# Patient Record
Sex: Male | Born: 1955
Health system: Southern US, Community
[De-identification: ages and names within clinical notes are randomized; demographics above are authoritative.]

## PROBLEM LIST (undated history)

## (undated) DIAGNOSIS — N189 Chronic kidney disease, unspecified: Secondary | ICD-10-CM

## (undated) DIAGNOSIS — S82899A Other fracture of unspecified lower leg, initial encounter for closed fracture: Secondary | ICD-10-CM

## (undated) DIAGNOSIS — C801 Malignant (primary) neoplasm, unspecified: Secondary | ICD-10-CM

## (undated) DIAGNOSIS — E039 Hypothyroidism, unspecified: Secondary | ICD-10-CM

## (undated) HISTORY — PX: JOINT REPLACEMENT: SHX530

## (undated) HISTORY — PX: HERNIA REPAIR: SHX51

## (undated) HISTORY — PX: OTHER SURGICAL HISTORY: SHX169

## (undated) HISTORY — PX: CHOLECYSTECTOMY: SHX55

---

## 1999-02-28 ENCOUNTER — Emergency Department (HOSPITAL_COMMUNITY): Admission: EM | Admit: 1999-02-28 | Discharge: 1999-02-28 | Payer: Self-pay | Admitting: Emergency Medicine

## 2000-07-05 ENCOUNTER — Inpatient Hospital Stay: Admission: EM | Admit: 2000-07-05 | Discharge: 2000-07-07 | Payer: Self-pay | Admitting: *Deleted

## 2000-07-06 ENCOUNTER — Encounter: Payer: Self-pay | Admitting: General Surgery

## 2000-07-06 ENCOUNTER — Encounter (INDEPENDENT_AMBULATORY_CARE_PROVIDER_SITE_OTHER): Payer: Self-pay | Admitting: Specialist

## 2001-08-28 ENCOUNTER — Encounter: Payer: Self-pay | Admitting: Internal Medicine

## 2001-08-28 ENCOUNTER — Ambulatory Visit (HOSPITAL_COMMUNITY): Admission: RE | Admit: 2001-08-28 | Discharge: 2001-08-28 | Payer: Self-pay | Admitting: Internal Medicine

## 2001-09-17 ENCOUNTER — Ambulatory Visit (HOSPITAL_COMMUNITY): Admission: RE | Admit: 2001-09-17 | Discharge: 2001-09-17 | Payer: Self-pay | Admitting: Internal Medicine

## 2003-04-28 ENCOUNTER — Encounter (INDEPENDENT_AMBULATORY_CARE_PROVIDER_SITE_OTHER): Payer: Self-pay | Admitting: Specialist

## 2003-04-28 ENCOUNTER — Ambulatory Visit (HOSPITAL_COMMUNITY): Admission: RE | Admit: 2003-04-28 | Discharge: 2003-04-28 | Payer: Self-pay

## 2004-12-18 ENCOUNTER — Ambulatory Visit: Payer: Self-pay | Admitting: Gastroenterology

## 2005-09-24 ENCOUNTER — Ambulatory Visit: Payer: Self-pay | Admitting: Internal Medicine

## 2005-10-01 ENCOUNTER — Encounter (INDEPENDENT_AMBULATORY_CARE_PROVIDER_SITE_OTHER): Payer: Self-pay | Admitting: Specialist

## 2005-10-01 ENCOUNTER — Ambulatory Visit: Payer: Self-pay | Admitting: Internal Medicine

## 2006-10-03 ENCOUNTER — Encounter: Admission: RE | Admit: 2006-10-03 | Discharge: 2006-10-03 | Payer: Self-pay | Admitting: Sports Medicine

## 2008-09-10 ENCOUNTER — Encounter: Admission: RE | Admit: 2008-09-10 | Discharge: 2008-09-10 | Payer: Self-pay | Admitting: Sports Medicine

## 2010-08-24 ENCOUNTER — Encounter: Payer: Self-pay | Admitting: Internal Medicine

## 2010-10-11 ENCOUNTER — Encounter: Admission: RE | Admit: 2010-10-11 | Discharge: 2010-10-11 | Payer: Self-pay | Admitting: Neurosurgery

## 2010-12-19 NOTE — Letter (Signed)
Summary: Colonoscopy Letter  [facility]  [address] [place], [place] [address]   Phone: [phone]  Fax: [phone]      August 24, 2010 MRN: [id number]   Kyle Glass [address] [place], [place]  [address]   Dear [name],   According to your medical record, it is time for you to schedule a Colonoscopy. The American Cancer Society recommends this procedure as a method to detect early colon cancer. Patients with a family history of colon cancer, or a personal history of colon polyps or inflammatory bowel disease are at increased risk.  This letter has been generated based on the recommendations made at the time of your procedure. If you feel that in your particular situation this may no longer apply, please contact our office.  Please call our office at [phone] to schedule this appointment or to update your records at your earliest convenience.  Thank you for cooperating with [place] to provide you with the very best care possible.   Sincerely,  Wilhemina Bonito. Marina Goodell, M.D.  [facility] [phone]

## 2011-04-06 NOTE — Op Note (Signed)
[facility]  Patient:    PEDER, ALLUMS                         MRN: [id number] Proc. Date: 07/06/00 Adm. Date:  [id number] Disc. Date: [id number] Attending:  Chevis Pretty S                           Operative Report  PREOPERATIVE DIAGNOSIS:  Cholecystitis with cholelithiasis.  POSTOPERATIVE DIAGNOSIS:  Cholecystitis with cholelithiasis.  OPERATION PERFORMED:  Laparoscopic cholecystectomy with intraoperative cholangiogram.  SURGEON:  Chevis Pretty, M.D.  ASSISTANT:  Thornton Park. Daphine Deutscher, M.D.  ANESTHESIA:  General endotracheal.  DESCRIPTION OF PROCEDURE:  After informed consent was obtained, the patient was brought to the operating room and placed in the supine position on the operating room table.  After adequate induction of general anesthesia, the patients abdomen was prepped with Betadine and draped in the usual sterile manner.  An infraumbilical transverse incision was made with the 15 blade knife.  This was carried down through the skin and subcutaneous tissue with blunt dissection until the fascia of the anterior abdominal wall was encountered.  The fascia was incised with the 15 blade and each side was grasped with Kocher clamps and elevated.  The peritoneum was probed with a tonsil clamp until entry into the abdomen was accomplished.  The anterior abdominal wall was palpated with the finger and was free of any adhesions.  A 0 Vicryl pursestring stitch was placed around the fascia of this incision. The Hasson cannula was then placed through this and the abdomen was insufflated with carbon dioxide.  The patient tolerated this well.  Next, a small transverse upper midline incision was made with the 15 blade knife.  A 10 mm trocar was placed through this incision into the abdomen through the falciform ligament under direct vision.  Two small incisions were placed laterally on the right side of the abdomen below the costal margin.  A 5 mm trocar was  placed through each one of these incisions into the abdomen again under direct vision.  A blunt grasper was placed through the lateral most port and used to grasp the dome of the gallbladder and lift it anteriorly and superiorly.  A blunt grasper was then placed to the other lateral port and used to retract on the fundus of the gallbladder.  A dissector was placed through the upper midline port used to bluntly dissect the peritoneal reflection at the cystic duct gallbladder junction.  The peritoneal reflection was incised with the dissector using the Bovie cautery and then blunt dissection was used to circumferentially dissect out the cystic duct gallbladder junction.  During this portion of the procedure, it did appear that there was a stone lodged in this area of the gallbladder neck and cystic duct junction.  It was able to be milked back into the gallbladder.  A clip was placed distally on the gallbladder neck and the cystic duct gallbladder junction was opened with a pair of laparoscopic scissors.  This area was only partially transected so that bile could be milked back through this hole.  Next, a 12-gauge angiocath was placed through the anterior abdominal wall and a catheter was placed through this.  The catheter was able to be placed in the cystic duct using the [facility] and was anchored in place with a clip.  An intraoperative cholangiogram was obtained which showed a long cystic  duct and no filling defects within the cystic duct or common ducts.  Contrast readily filled the duodenum.  The retractors were then placed as they had been previously described and the proximal clip was removed.  The catheter was also removed from the cystic duct.  Three clips were then placed proximally on the cystic duct and the rest of the cystic duct was divided between the three proximal and one distal clip.  Next, the cystic artery was identified and was dissected free circumferentially  using the [place].  Three proximal clips and one distal clip was placed on the cystic artery and the artery was then divided between the two using the laparoscopic scissors.  A small accessory vessel was identified posterior to this and two clips were placed proximally and one distally and that vessel was divided between the two.  Next, hook cautery was used to dissect the gallbladder from the liver bed using a combination of electrocautery and blunt dissection before the gallbladder was completely removed from the liver bed.  Several bleeding points were cauterized and the liver bed was then examined and found to be hemostatic.  The gallbladder was then removed from the liver bed.  The laparoscopic camera was then exchanged into the upper midline 10 mm port and an endo bag was placed through the Hasson cannula.  The gallbladder was placed within the endo bag and removed through the umbilical port under direct vision.  The Hasson cannula was then replaced through the umbilical port and a laparoscope was switched back to the Hasson cannula.  The liver bed was examined one more time and found to be hemostatic.  The abdomen was irrigated with copious amounts of saline.  All the ports were then removed under direct vision and the gas allowed to escape the abdomen.  The umbilical fascia was closed with the previously placed 0 Vicryl pursestring.  The rest of the skin incisions were closed with interrupted 4-0 Monocryl subcuticular stitches. Benzoin and Steri-Strips were applied.  The patient tolerated the procedure well at the end of the case.  All needle, sponge and instrument counts were correct.  He was awakened and taken to the recovery room in stable condition. D:  07/06/00 TD:  07/08/00 Job: [id number]

## 2011-04-06 NOTE — Discharge Summary (Signed)
[facility]  Patient:    Kyle Glass, Kyle Glass                         MRN: [id number] Adm. Date:  [id number] Disc. Date: [id number] Attending:  Caleen Essex                           Discharge Summary  REASON FOR ADMISSION:  [name] is a 55 year old white male who was admitted on the 17th with acute cholecystitis with cholelithiasis.  He was kept on antibiotics overnight and the following day was taken to the operating room where he underwent laparoscopic cholecystectomy with intraoperative cholangiogram.  The cholangiogram appeared to be normal.  Postoperatively he was started on a diet, and he has been ambulating without difficulty and urinating without difficulty.  He was doing well and was ready for discharged to home.  DISCHARGE MEDICATIONS: 1. Home medicines. 2. Vicodin 1-2 q.4-6h. p.r.n.  DIET:  Regular.  ACTIVITY:  Do nothing strenuous for the next several weeks.  CONDITION ON DISCHARGE:  Stable. DD:  07/07/00 TD:  07/08/00 Job: [id number]

## 2011-04-06 NOTE — H&P (Signed)
[facility]  Patient:    Kyle Glass, Kyle Glass                         MRN: [id number] Adm. Date:  [id number] Disc. Date: [id number] Attending:  Caleen Essex                         History and Physical  HISTORY OF PRESENT ILLNESS:  [name] is a 55 year old white male who began having right upper quadrant pain starting yesterday.  This was associated with nausea, but no vomiting.  The pain became severe in nature and he sought medical attention at the hospital.  He denies any chest pain, shortness of breath, diarrhea, dysuria, constipation.  REVIEW OF SYSTEMS:  His other review of systems is unremarkable.  PAST MEDICAL HISTORY:  Significant for a testicular torsion, bilateral inguinal hernias, but he is otherwise in good health.  PAST SURGICAL HISTORY:  Significant for bilateral inguinal hernia repairs and removal of one of his testicles.  MEDICATIONS:  None.  ALLERGIES:  No known drug allergies.  SOCIAL HISTORY:  He denies any use of alcohol or tobacco.  FAMILY HISTORY:  Significant for diabetes and lung cancer.  PHYSICAL EXAMINATION:  GENERAL:  He is a well-developed, well-nourished white male in no acute distress.  VITAL SIGNS:  Blood pressure 147/100, pulse 43, temperature 97.3.  SKIN:  Warm and dry.  NECK:  No bruits.  LUNGS:  Clear to auscultation bilaterally.  HEART:  Regular rate and rhythm without murmurs.  ABDOMEN:  Soft and nontender at the time of exam.  EXTREMITIES:  Show no clubbing, cyanosis, or edema with good peripheral pulses.  NEUROLOGIC:  Alert and oriented x4.  HEMATOLOGIC:  He has no palpable lymphadenopathy.  LABORATORY DATA:  He underwent a right upper quadrant ultrasound that showed the presence of gallstones, but no ductal dilatation.  He did have some mild gallbladder wall thickening, but no pericholecystic fluid.  His amylase was 56, sodium 137, potassium 4.5, chloride 104, CO2 29, BUN 16, creatinine 1,  glucose 109.  His total bilirubin was 1.1, alkaline phosphatase 58, SGOT 38.  White count 7400 with 74 segmented neutrophils and 19 lymphocytes, hemoglobin 15.4, hematocrit 43.5, platelet count 180,000.  Urine was normal.  ASSESSMENT/PLAN:  This is a 55 year old white male with cholecystitis with cholelithiasis.  We will admit him for IV fluids and antibiotics and plan on performing a cholecystectomy in the next couple of days. DD:  07/05/00 TD:  07/06/00 Job: [id number]

## 2011-08-13 ENCOUNTER — Other Ambulatory Visit: Payer: Self-pay | Admitting: Orthopaedic Surgery

## 2011-08-13 ENCOUNTER — Encounter (HOSPITAL_COMMUNITY): Payer: BC Managed Care – PPO

## 2011-08-13 LAB — URINALYSIS, ROUTINE W REFLEX MICROSCOPIC
Bilirubin Urine: NEGATIVE
Glucose, UA: NEGATIVE mg/dL
Hgb urine dipstick: NEGATIVE
Leukocytes, UA: NEGATIVE
Protein, ur: NEGATIVE mg/dL
Specific Gravity, Urine: 1.012 (ref 1.005–1.030)
pH: 6 (ref 5.0–8.0)

## 2011-08-13 LAB — APTT: aPTT: 32 seconds (ref 24–37)

## 2011-08-13 LAB — BASIC METABOLIC PANEL
BUN: 18 mg/dL (ref 6–23)
CO2: 31 mEq/L (ref 19–32)
Creatinine, Ser: 0.84 mg/dL (ref 0.50–1.35)
GFR calc Af Amer: >60 mL/min (ref >60)
GFR calc non Af Amer: >60 mL/min (ref >60)
Glucose, Bld: 88 mg/dL (ref 70–99)
Sodium: 140 mEq/L (ref 135–145)

## 2011-08-13 LAB — CBC
MCH: 31.7 pg (ref 26.0–34.0)
RBC: 4.54 MIL/uL (ref 4.22–5.81)

## 2011-08-13 LAB — SURGICAL PCR SCREEN
MRSA, PCR: NEGATIVE
Staphylococcus aureus: NEGATIVE

## 2011-08-17 ENCOUNTER — Inpatient Hospital Stay (HOSPITAL_COMMUNITY): Payer: BC Managed Care – PPO

## 2011-08-17 ENCOUNTER — Inpatient Hospital Stay (HOSPITAL_COMMUNITY)
Admission: RE | Admit: 2011-08-17 | Discharge: 2011-08-20 | DRG: 818 | Disposition: A | Discharge status: Home Health | Payer: BC Managed Care – PPO | Source: Ambulatory Visit | Attending: Orthopaedic Surgery | Admitting: Orthopaedic Surgery

## 2011-08-17 DIAGNOSIS — Z01810 Encounter for preprocedural cardiovascular examination: Secondary | ICD-10-CM

## 2011-08-17 DIAGNOSIS — M1610 Unilateral primary osteoarthritis, unspecified hip: Principal | ICD-10-CM | POA: Diagnosis present

## 2011-08-17 DIAGNOSIS — Z01812 Encounter for preprocedural laboratory examination: Secondary | ICD-10-CM

## 2011-08-17 DIAGNOSIS — Z87442 Personal history of urinary calculi: Secondary | ICD-10-CM

## 2011-08-17 DIAGNOSIS — E079 Disorder of thyroid, unspecified: Secondary | ICD-10-CM | POA: Diagnosis present

## 2011-08-17 DIAGNOSIS — M169 Osteoarthritis of hip, unspecified: Principal | ICD-10-CM | POA: Diagnosis present

## 2011-08-17 LAB — TYPE AND SCREEN: Antibody Screen: NEGATIVE

## 2011-08-17 LAB — ABO/RH: ABO/RH(D): O NEG

## 2011-08-18 LAB — CBC
MCHC: 34.9 g/dL (ref 30.0–36.0)
Platelets: 138 10*3/uL — ABNORMAL LOW (ref 150–400)

## 2011-08-18 LAB — BASIC METABOLIC PANEL
BUN: 14 mg/dL (ref 6–23)
Calcium: 8.5 mg/dL (ref 8.4–10.5)
GFR calc Af Amer: >60 mL/min (ref >60)
GFR calc non Af Amer: >60 mL/min (ref >60)
Glucose, Bld: 130 mg/dL — ABNORMAL HIGH (ref 70–99)
Potassium: 3.9 mEq/L (ref 3.5–5.1)
Sodium: 136 mEq/L (ref 135–145)

## 2011-08-18 NOTE — Op Note (Signed)
NAMELION, FERNANDEZ NO.:  [id number]  MEDICAL RECORD NO.:  [id number]  LOCATION:  0006                         FACILITY:  [facility]  PHYSICIAN:  Vanita Panda. Magnus Ivan, M.D.DATE OF BIRTH:  [birth year]  DATE OF PROCEDURE:  08/17/2011 DATE OF DISCHARGE:                              OPERATIVE REPORT   PREOPERATIVE DIAGNOSES:  Severe osteoarthritis and degenerative joint disease, right hip.  POSTOPERATIVE DIAGNOSES:  Severe osteoarthritis and degenerative joint disease, right hip.  PROCEDURE:  Right total hip arthroplasty with a direct anterior approach.  IMPLANTS:  DePuy P[place] acetabular component size 54, size 10 [place] femoral component with HA-coating and standard offset, size 36 plus 1.5 ceramic hip ball, size 36 plus 4 neutral polyethylene liner.  SURGEON:  Vanita Panda. Magnus Ivan, M.D.  ASSISTANT:  Wende Neighbors, P.A.  ANESTHESIA:  General.  ANTIBIOTICS:  2 g IV Ancef.  BLOOD LOSS:  700 cc.  COMPLICATIONS:  None.  INDICATIONS:  Mr. Eaton is a 55 year old very active individual who is an avid extreme athlete especially with biking.  He developed right hip pain many years ago and has been through 2 steroid injections after he was confirmed that he had severe end-stage arthritis with bone-on-bone wear.  It has gotten to where his hip locks and catches on him, the pain is severe.  The injection of his antiinflammatory is not working and rest is not working either.  At this point, he wishes to proceed with a total hip arthroplasty due to the severe impact it has had on his activities of daily living and his life in general.  The risks and benefits of the surgery have been explained to him in detail including the risk of DVT, acute blood loss anemia, and PE and he does wish to proceed with surgery.  DESCRIPTION OF THE PROCEDURE:  After informed consent was obtained, appropriate right hip was marked.  He was brought to the operating  room, general anesthesia was obtained while he was on the stretcher.  A Foley catheter was placed and he was placed on the Hana Table after traction boots were placed as well.  A perineal post was placed and all traction was off.  His right hip was then prepped and draped with DuraPrep and sterile drapes using direct fluoroscopy to assess the center of the hip as well as the hip in general for an implant placement.  An incision was then made just distal to the anterior superior iliac spine and posterior to this.  The incision was carried down the leg and I dissected down to the tensor fascia lata.  This was then divided in an oblique fashion. Then I proceeded with a direct anterior approach.  Curved retractors were placed medially and laterally.  I cauterized the lateral femoral circumflex vessels with electrocautery.  I then opened up the joint capsule in an oblique and T-type fashion, placing the Cobra retractors within the hip capsule.  I then made my femoral neck cut proximal to the lesser trochanter and removed the head in its entirety.  There was complete loss of cartilage on the head and there were several osteophytes and loose bodies within the  socket.  There were peripheral osteophytes as well and all these were removed including any remnants of soft tissue.  I then began reaming from a size 44 reamer all the way up to a size 53 with the last few reamers placed under direct fluoroscopy. I then placed the real 54 acetabular component and knocked this into place and it was nice and stable, so I did not need to place any screws. I then placed the polyethylene liner, which is the real 36 plus 4 neutral polyethylene liner.  Attention was turned to the femur with all traction off the leg.  A temporary hook was placed underneath the vastus ridge and then I externally rotated the hip to 90 degrees, extended and adducted down the hip.  This allowed access to the proximal femur.  The  lateral capsule was released as well as the piriformis and this allowed [place] to deliver the femur higher.  I then began broaching from a size 8 broach up to a size 10 broach.  I used a calcar planer to plane over this and then trialed a standard neck with a 36 plus 1.5 hip ball.  We reduced this in the acetabulum.  It was stable to external and internal rotation with minimal shuck and his leg lengths were measured to be equal.  We then removed all trial instrumentation and placed the real HA-coated femoral component size 10 with a collar followed by the real 36 plus 1.5 hip ball, which was ceramic.  We reduced this back in the acetabulum, it was stable.  We then copiously irrigated all tissues.  We closed the joint capsule with an interrupted #1 Ethibond suture followed by a running #1 Ethibond in the tensor fascia lata.  A 2-0 Vicryl was placed in the subcutaneous tissue and staples on the skin.  Well padded sterile dressing was applied.  The patient was then taken off the Hana table on a stretcher, awakened, extubated, and taken to the recovery room in stable condition.  His leg lengths were equal and all final counts were correct.  Of note, Maud Deed, P.A.C was present throughout the entirety of the case with her assistance in the interval and the exposure and placement of implant.     Vanita Panda. Magnus Ivan, M.D.     CYB/MEDQ  D:  08/17/2011  T:  08/17/2011  Job:  [id number]  Electronically Signed by Doneen Poisson M.D. on 08/18/2011 11:46:45 AM

## 2011-08-18 NOTE — H&P (Signed)
  NAMEFAIZ, WEBER NO.:  [id number]  MEDICAL RECORD NO.:  [id number]  LOCATION:  0006                         FACILITY:  [facility]  PHYSICIAN:  Vanita Panda. Magnus Ivan, M.D.DATE OF BIRTH:  01/19/1956  DATE OF ADMISSION:  08/17/2011 DATE OF DISCHARGE:                             HISTORY & PHYSICAL   CHIEF COMPLAINT:  Severe right hip pain.  HISTORY OF PRESENT ILLNESS:  Mr. Davoli is a patient of mine who is a 55- year-old active individual.  He has had severe arthritis in his right hip for some time now.  He has basically bone-on-bone wear.  He has already had 2 steroid injections in the right hip and is a very athletic person.  He states it worsened at night and during the daytime, is starting to catch him quite a bit, that is well with different activities.  He is now ready to proceed with a total hip arthroplasty due to the severe effects on his activities of daily living.  He has failed conservative treatment at this point.  PAST MEDICAL HISTORY:  Osteoarthritis and thyroid disease.  MEDICATIONS: 1. Baby aspirin. 2. Levothyroxine. 3. Timoptic eye drops. 4. Multivitamin. 5. Fish oil. 6. Antihistamine.  ALLERGIES:  No known drug allergies.  PREVIOUS SURGERIES:  Gallbladder removal in early 2003 and 2004.  FAMILY MEDICAL HISTORY:  Diabetes, lung cancer, high blood pressure.  SOCIAL HISTORY:  He is married.  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  He does not smoke and drinks alcohol occasionally.  REVIEW OF SYSTEMS:  Negative for chest pain, shortness of breath, fever, chills, nausea, and vomiting.  PHYSICAL EXAMINATION:  VITAL SIGNS:  He is afebrile with stable vital signs and his vitals can be seen on the chart. GENERAL:  He is alert and oriented x2, in no acute distress or obvious discomfort . HEENT:  Normocephalic, atraumatic.  Pupils equally round and reactive to light.  Extraocular muscles intact. LUNGS:  Clear to auscultation bilaterally. HEART:   Regular rate and rhythm. ABDOMEN:  Benign. EXTREMITIES:  His leg lengths were equal.  His right hip shows severe pain with internal or external rotation. NEUROLOGIC:  He is neurovascularly intact.  X-rays that accompany him show end-stage arthritis with a bone-on-bone wear of the right hip.  ASSESSMENT:  This is a gentleman with severe osteoarthritis of his right hip.  PLAN:  We are going to proceed with a total hip arthroplasty today.  He understands the risks and benefits of this surgery and does wish to proceed with this.     Vanita Panda. Magnus Ivan, M.D.     CYB/MEDQ  D:  08/17/2011  T:  08/17/2011  Job:  [id number]  Electronically Signed by Doneen Poisson M.D. on 08/18/2011 11:46:43 AM

## 2011-08-19 LAB — CBC
HCT: 32.8 % — ABNORMAL LOW (ref 39.0–52.0)
MCH: 33 pg (ref 26.0–34.0)
MCHC: 36 g/dL (ref 30.0–36.0)
WBC: 4.9 10*3/uL (ref 4.0–10.5)

## 2011-08-19 LAB — BASIC METABOLIC PANEL
CO2: 30 mEq/L (ref 19–32)
Calcium: 8.5 mg/dL (ref 8.4–10.5)
GFR calc Af Amer: >60 mL/min (ref >60)
Glucose, Bld: 111 mg/dL — ABNORMAL HIGH (ref 70–99)
Potassium: 3.9 mEq/L (ref 3.5–5.1)
Sodium: 140 mEq/L (ref 135–145)

## 2011-08-20 LAB — BASIC METABOLIC PANEL
BUN: 16 mg/dL (ref 6–23)
CO2: 29 mEq/L (ref 19–32)
GFR calc Af Amer: >90 mL/min (ref >90)
Glucose, Bld: 107 mg/dL — ABNORMAL HIGH (ref 70–99)
Sodium: 139 mEq/L (ref 135–145)

## 2011-08-20 LAB — CBC: Hemoglobin: 10.9 g/dL — ABNORMAL LOW (ref 13.0–17.0)

## 2011-08-24 NOTE — Discharge Summary (Signed)
  NAMEMACY, LINGENFELTER NO.:  [id number]  MEDICAL RECORD NO.:  [id number]  LOCATION:  1614                         FACILITY:  [facility]  PHYSICIAN:  Vanita Panda. Magnus Ivan, M.D.DATE OF BIRTH:  [birth year]  DATE OF ADMISSION:  08/17/2011 DATE OF DISCHARGE:  08/20/2011                              DISCHARGE SUMMARY   ADMITTING DIAGNOSES:  Severe degenerative joint disease and osteoarthritis of right hip.  DISCHARGE DIAGNOSES:  Severe degenerative joint disease and osteoarthritis of right hip.  PROCEDURE:  Right total hip arthroplasty through right anterior approach on August 17, 2011.  HOSPITAL COURSE:  Mr. Haughn is a 55 year old gentleman with bone-on-bone wear of his right hip.  He was taken to operating room on the day of admission where he underwent an elective right total hip arthroplasty due to a severe pain in the effect of the activities of daily living. Postoperatively, he was admitted as an inpatient and had an uneventful postoperative stay.  By the day of discharge, his incision was clean, dry, and intact.  He was afebrile with stable vital signs and was ready to discharge to home.  DISPOSITION:  Discharged to home.  DISCHARGE MEDICATIONS: 1. Percocet. 2. Xarelto. 3. Robaxin.  DISCHARGE INSTRUCTIONS:  While he is at home, he can weighbear as tolerated with no hip precautions.  Home health therapy will come in and work on balance, gait training, coordination.  We will see him back in the office in 2 weeks.     Vanita Panda. Magnus Ivan, M.D.     CYB/MEDQ  D:  08/20/2011  T:  08/20/2011  Job:  [id number]  Electronically Signed by Doneen Poisson M.D. on 08/24/2011 09:08:58 PM

## 2012-04-22 IMAGING — CR DG HIP 1V PORT*R*
1 series · 2 of 2 positions shown · non-contrast
Comparison: MRI of the right hip of 09/23/2006

CLINICAL DATA: Right hip replacement

PORTABLE RIGHT HIP - 1 VIEW

[Series 1: AP · right · 2 of 2 slices shown]
[im 1/2]
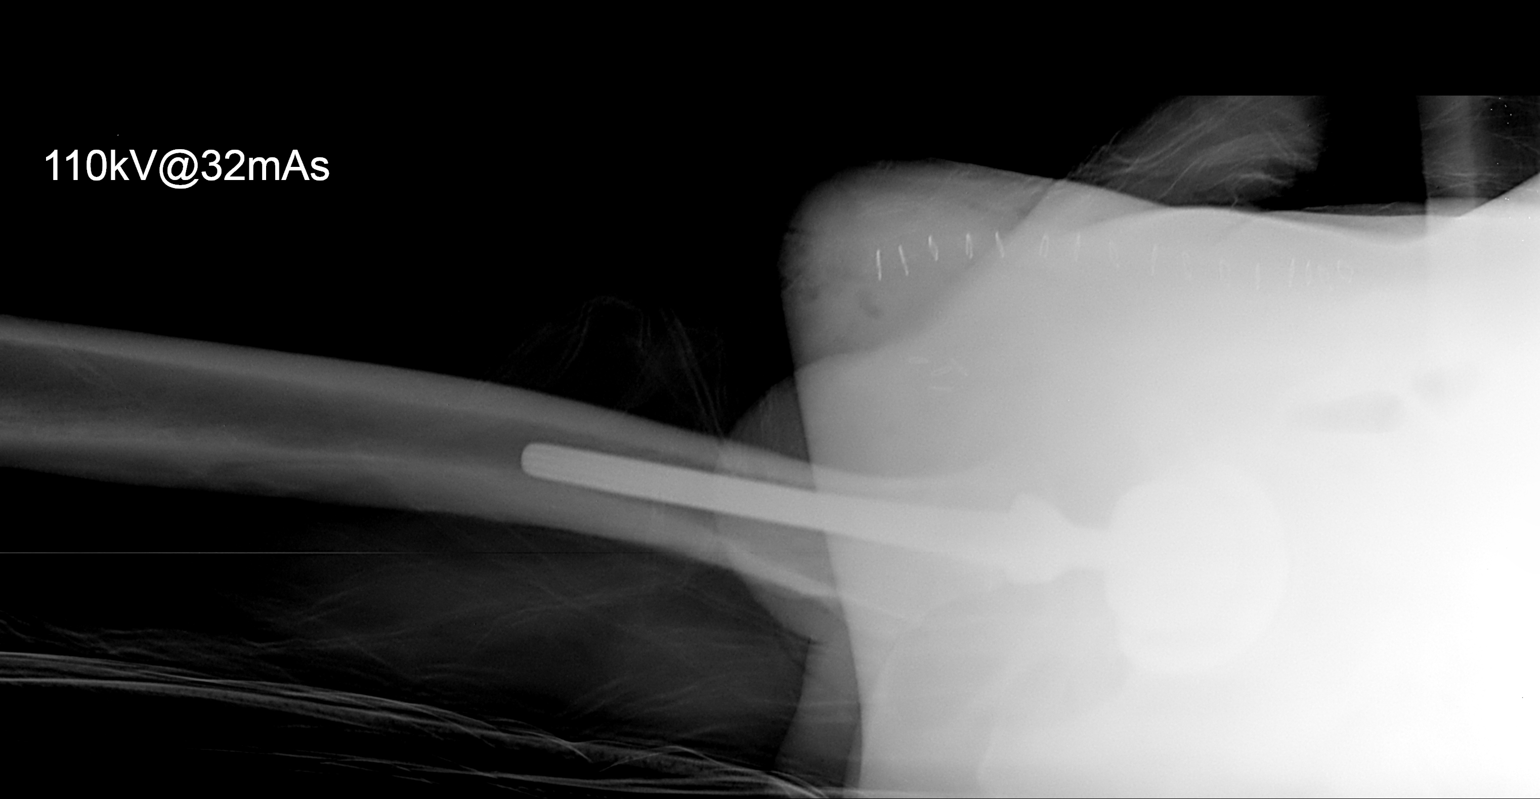
[im 2/2]
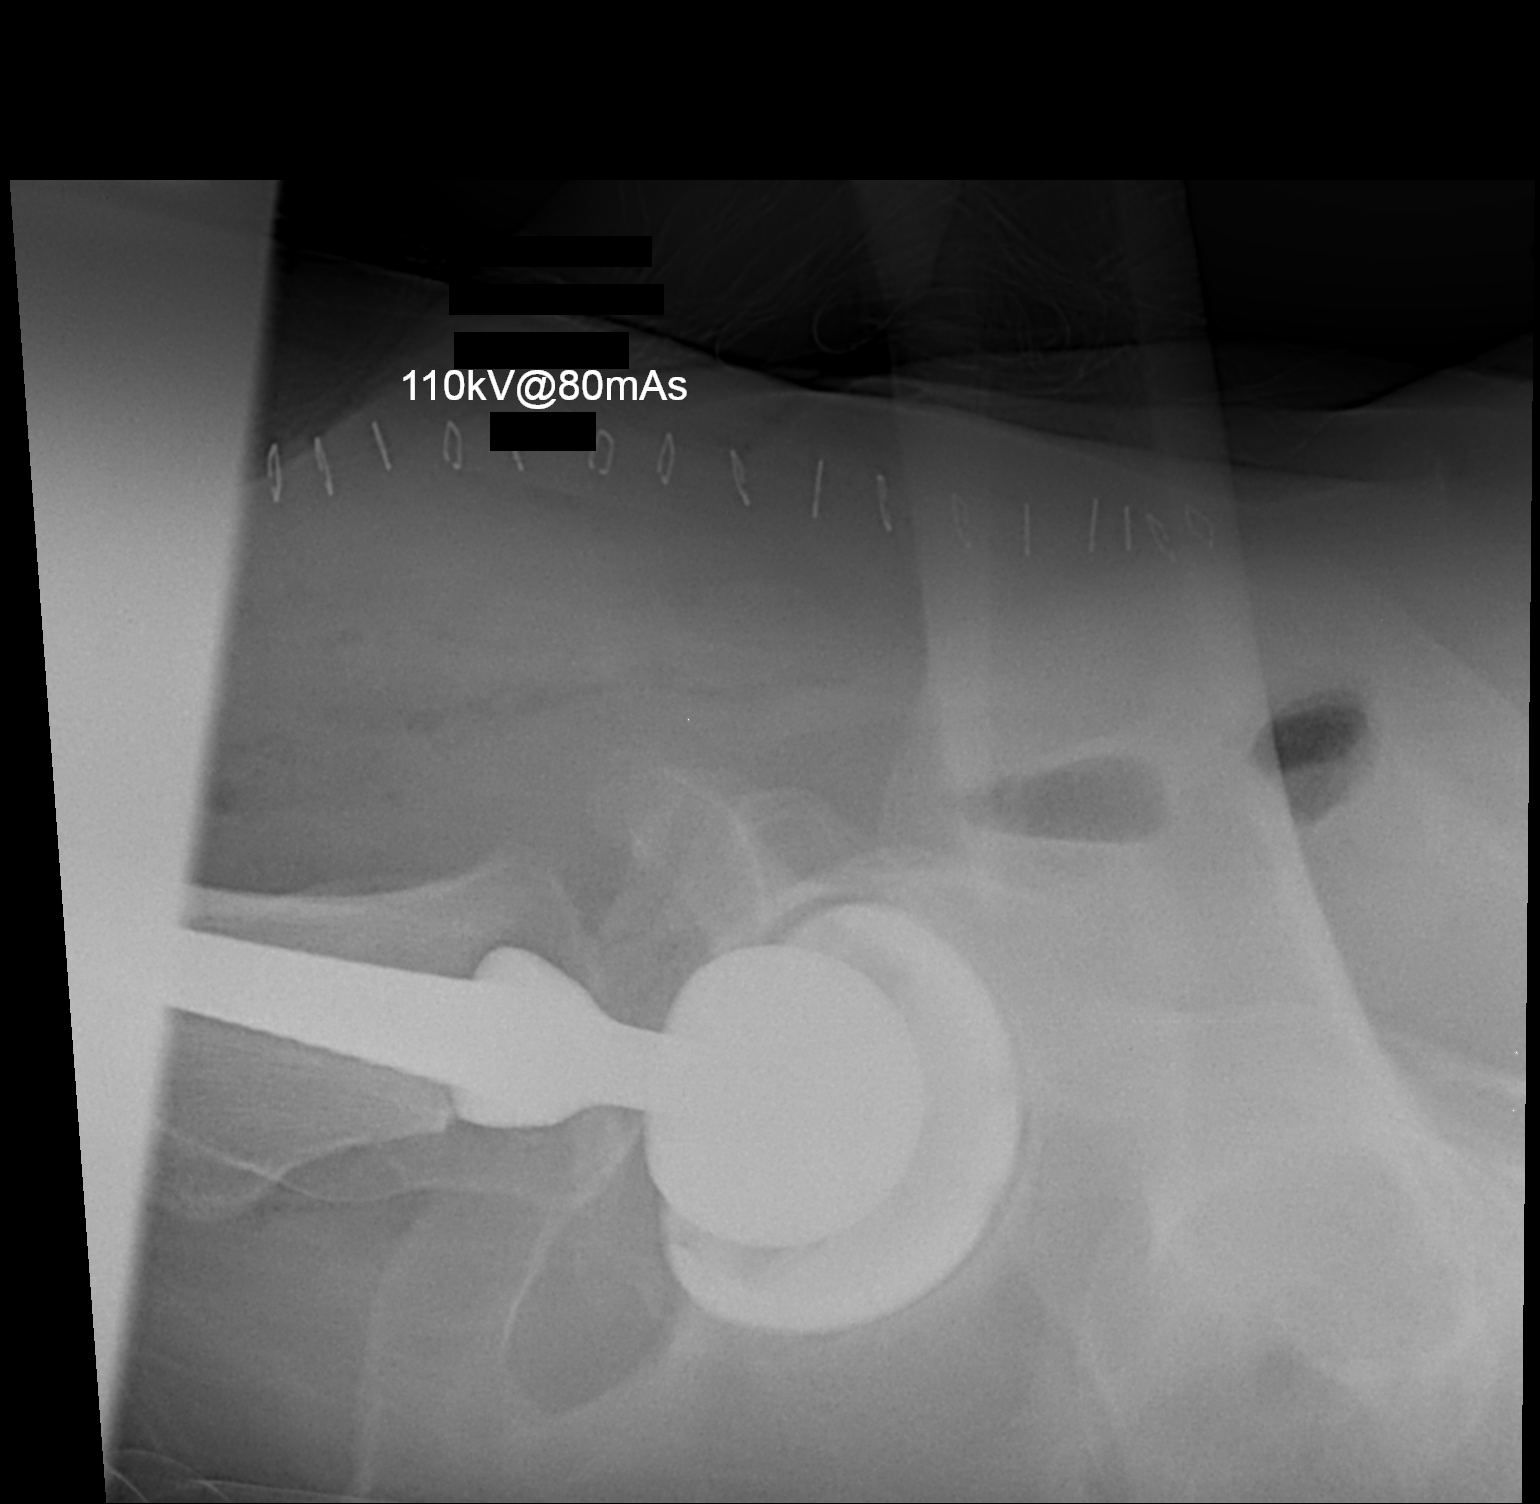

[2 of 2 positions shown; findings below may reference images not displayed]

FINDINGS: Two cross-table lateral views show the relationship of
the femoral and acetabular components to be in good position.  The
femoral stem appears to be in good position.  No acute fracture is
seen.
IMPRESSION: Right total hip replacement in good position on cross-table lateral
views.

## 2012-04-22 IMAGING — CR DG PORTABLE PELVIS
1 series · 1 of 1 positions shown · non-contrast
Comparison: Right hip lateral view 08/09/2011

CLINICAL DATA: Postop right hip replacement.

PORTABLE PELVIS

[AP]
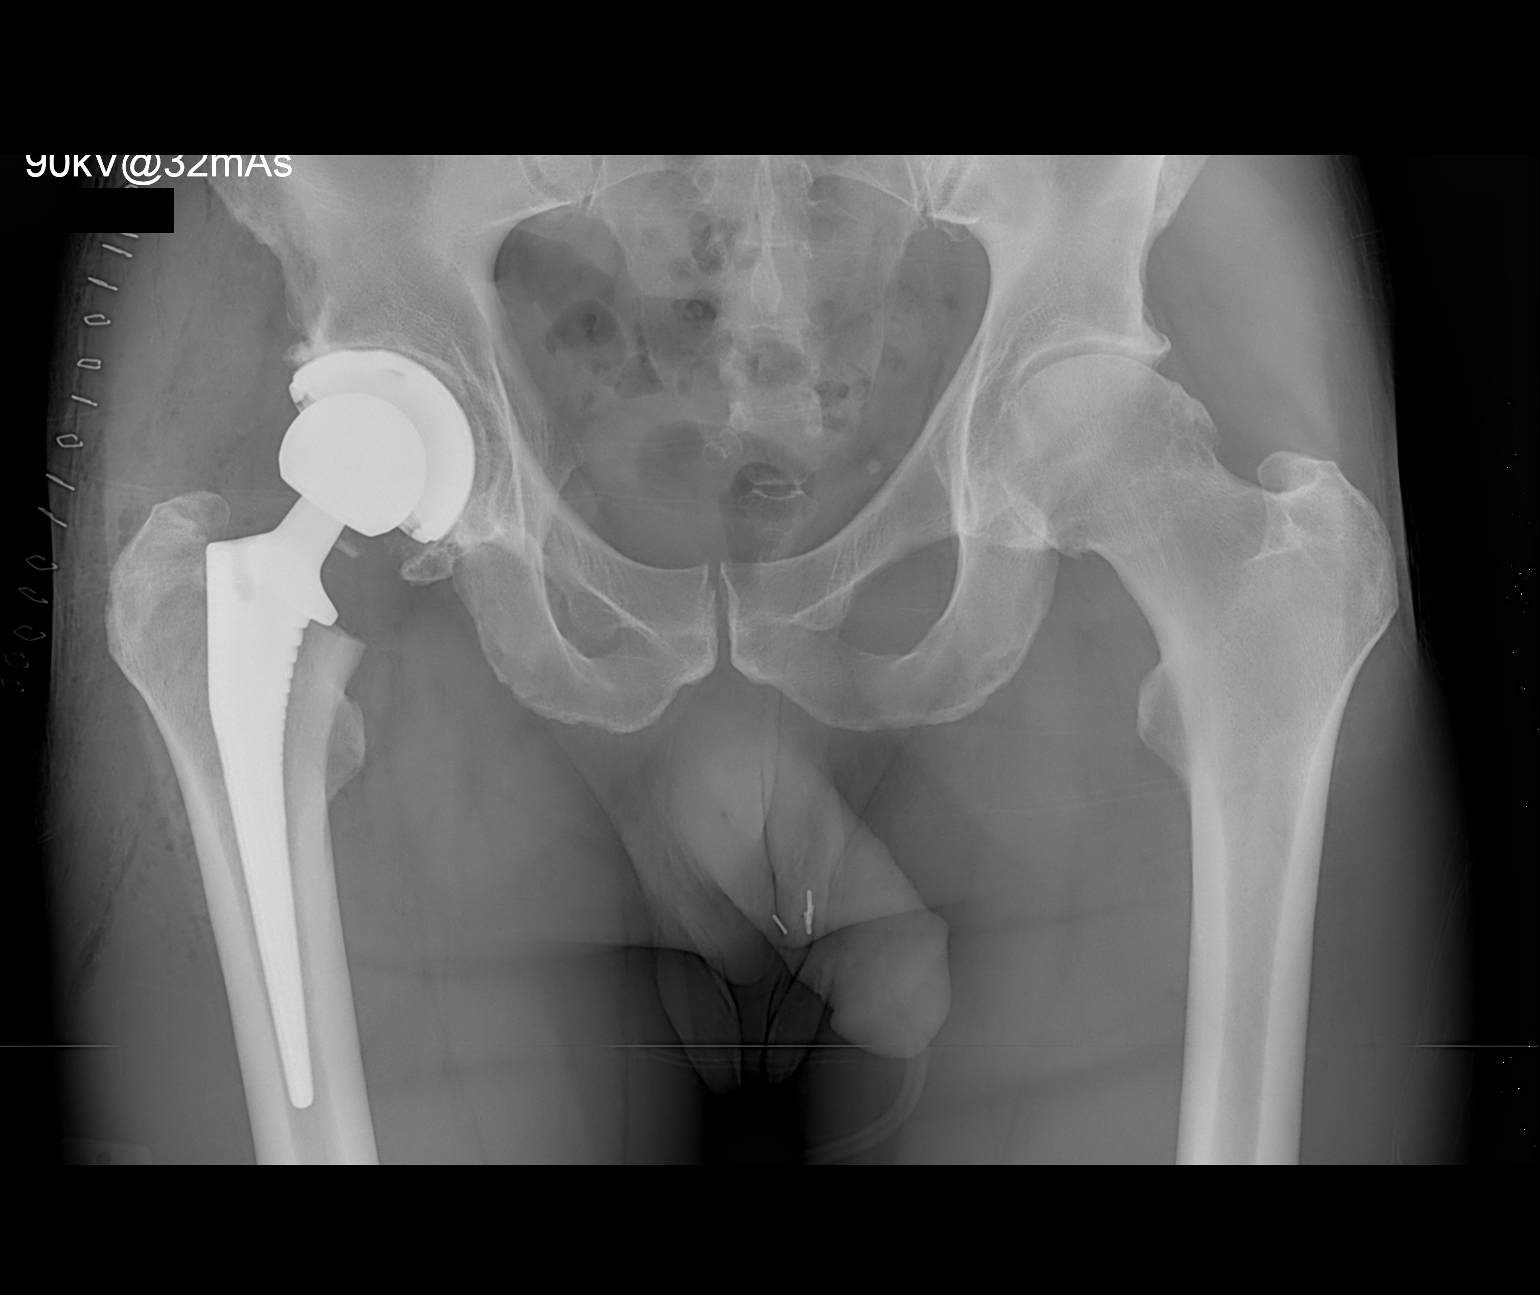

[1 of 1 positions shown; findings below may reference images not displayed]

FINDINGS: Single view of the pelvis demonstrates a right hip
arthroplasty.  Right hip appears is located.  Expected soft tissue
postoperative changes.  No evidence for a periprosthetic fracture.
Pelvic bony ring is intact.

There is medial joint space narrowing and osteophyte formation
involving the left hip.
IMPRESSION: Expected postoperative changes from a right hip replacement.  No
evidence for a hardware complication.

Degenerative changes in the left hip.

## 2012-08-08 ENCOUNTER — Encounter: Payer: Self-pay | Admitting: Internal Medicine

## 2014-04-28 ENCOUNTER — Encounter (HOSPITAL_COMMUNITY): Payer: Self-pay | Admitting: Pharmacy Technician

## 2014-04-28 ENCOUNTER — Other Ambulatory Visit (HOSPITAL_COMMUNITY): Payer: Self-pay | Admitting: Orthopedic Surgery

## 2014-04-29 ENCOUNTER — Encounter (HOSPITAL_COMMUNITY): Payer: Self-pay | Admitting: Pharmacy Technician

## 2014-04-29 ENCOUNTER — Encounter (HOSPITAL_COMMUNITY): Payer: Self-pay

## 2014-04-29 ENCOUNTER — Encounter (HOSPITAL_COMMUNITY)
Admission: RE | Admit: 2014-04-29 | Discharge: 2014-04-29 | Disposition: A | Discharge status: Home / Self Care | Payer: BC Managed Care – PPO | Source: Ambulatory Visit | Attending: Orthopedic Surgery | Admitting: Orthopedic Surgery

## 2014-04-29 HISTORY — DX: Chronic kidney disease, unspecified: N18.9

## 2014-04-29 HISTORY — DX: Hypothyroidism, unspecified: E03.9

## 2014-04-29 HISTORY — DX: Other fracture of unspecified lower leg, initial encounter for closed fracture: S82.899A

## 2014-04-29 HISTORY — DX: Malignant (primary) neoplasm, unspecified: C80.1

## 2014-04-29 LAB — CBC
HEMATOCRIT: 40 % (ref 39.0–52.0)
HEMOGLOBIN: 13.6 g/dL (ref 13.0–17.0)
MCH: 32.2 pg (ref 26.0–34.0)
MCHC: 34 g/dL (ref 30.0–36.0)
MCV: 94.6 fL (ref 78.0–100.0)
PLATELETS: 143 10*3/uL — AB (ref 150–400)
RBC: 4.23 MIL/uL (ref 4.22–5.81)
RDW: 12.7 % (ref 11.5–15.5)
WBC: 4.1 10*3/uL (ref 4.0–10.5)

## 2014-04-29 LAB — COMPREHENSIVE METABOLIC PANEL
ALT: 30 U/L (ref 0–53)
AST: 56 U/L — AB (ref 0–37)
Albumin: 4.3 g/dL (ref 3.5–5.2)
Alkaline Phosphatase: 50 U/L (ref 39–117)
BILIRUBIN TOTAL: 1 mg/dL (ref 0.3–1.2)
BUN: 15 mg/dL (ref 6–23)
CALCIUM: 9.3 mg/dL (ref 8.4–10.5)
CO2: 27 meq/L (ref 19–32)
Chloride: 102 mEq/L (ref 96–112)
Creatinine, Ser: 0.9 mg/dL (ref 0.50–1.35)
GFR calc Af Amer: >90 mL/min (ref >90)
GLUCOSE: 91 mg/dL (ref 70–99)
Potassium: 4.2 mEq/L (ref 3.7–5.3)
Sodium: 141 mEq/L (ref 137–147)
Total Protein: 6.6 g/dL (ref 6.0–8.3)

## 2014-04-29 LAB — PROTIME-INR
INR: 1.12 (ref 0.00–1.49)
Prothrombin Time: 14.2 seconds (ref 11.6–15.2)

## 2014-04-29 LAB — APTT: aPTT: 31 seconds (ref 24–37)

## 2014-04-29 MED ORDER — CEFAZOLIN SODIUM-DEXTROSE 2-3 GM-% IV SOLR
2.0000 g | INTRAVENOUS | Status: AC
  Administered 2014-04-30: 2 g via INTRAVENOUS
  Filled 2014-04-29: qty 50

## 2014-04-29 NOTE — Pre-Procedure Instructions (Signed)
VISHRUTH SEOANE  04/29/2014   Your procedure is scheduled on:  04/30/14  Report to [facility] Admitting at 12 noon  Call this number if you have problems the morning of surgery: [phone]   Remember:   Do not eat food or drink liquids after midnight.   Take these medicines the morning of surgery with A SIP OF WATER: flexeril,synthroid,flomax,eye drops   Do not wear jewelry, make-up or nail polish.  Do not wear lotions, powders, or perfumes. You may wear deodorant.  Do not shave 48 hours prior to surgery. Men may shave face and neck.  Do not bring valuables to the hospital.  [facility] is not responsible                  for any belongings or valuables.               Contacts, dentures or bridgework may not be worn into surgery.  Leave suitcase in the car. After surgery it may be brought to your room.  For patients admitted to the hospital, discharge time is determined by your                treatment team.               Patients discharged the day of surgery will not be allowed to drive  home.  Name and phone number of your driver: family  Special Instructions: Shower using CHG 2 nights before surgery and the night before surgery.  If you shower the day of surgery use CHG.  Use special wash - you have one bottle of CHG for all showers.  You should use approximately 1/3 of the bottle for each shower.   Please read over the following fact sheets that you were given: Pain Booklet, Coughing and Deep Breathing and Surgical Site Infection Prevention

## 2014-04-30 ENCOUNTER — Encounter (HOSPITAL_COMMUNITY): Payer: BC Managed Care – PPO | Admitting: Certified Registered"

## 2014-04-30 ENCOUNTER — Encounter (HOSPITAL_COMMUNITY): Payer: Self-pay | Admitting: Certified Registered"

## 2014-04-30 ENCOUNTER — Encounter (HOSPITAL_COMMUNITY)
Admission: RE | Disposition: A | Discharge status: Home / Self Care | Payer: Self-pay | Source: Ambulatory Visit | Attending: Orthopedic Surgery

## 2014-04-30 ENCOUNTER — Ambulatory Visit (HOSPITAL_COMMUNITY)
Admission: RE | Admit: 2014-04-30 | Discharge: 2014-04-30 | Disposition: A | Discharge status: Home / Self Care | Payer: BC Managed Care – PPO | Source: Ambulatory Visit | Attending: Orthopedic Surgery | Admitting: Orthopedic Surgery

## 2014-04-30 ENCOUNTER — Ambulatory Visit (HOSPITAL_COMMUNITY): Payer: BC Managed Care – PPO | Admitting: Certified Registered"

## 2014-04-30 DIAGNOSIS — S43109A Unspecified dislocation of unspecified acromioclavicular joint, initial encounter: Secondary | ICD-10-CM | POA: Insufficient documentation

## 2014-04-30 DIAGNOSIS — E039 Hypothyroidism, unspecified: Secondary | ICD-10-CM | POA: Insufficient documentation

## 2014-04-30 DIAGNOSIS — Z87891 Personal history of nicotine dependence: Secondary | ICD-10-CM | POA: Insufficient documentation

## 2014-04-30 DIAGNOSIS — Z87442 Personal history of urinary calculi: Secondary | ICD-10-CM | POA: Insufficient documentation

## 2014-04-30 DIAGNOSIS — Y9239 Other specified sports and athletic area as the place of occurrence of the external cause: Secondary | ICD-10-CM | POA: Insufficient documentation

## 2014-04-30 DIAGNOSIS — Y92838 Other recreation area as the place of occurrence of the external cause: Secondary | ICD-10-CM

## 2014-04-30 HISTORY — PX: ACROMIO-CLAVICULAR JOINT REPAIR: SHX5183

## 2014-04-30 SURGERY — REPAIR, ACROMIOCLAVICULAR JOINT
Anesthesia: Regional | Site: Shoulder | Laterality: Left

## 2014-04-30 MED ORDER — EPHEDRINE SULFATE 50 MG/ML IJ SOLN
INTRAMUSCULAR | Status: DC | PRN
  Administered 2014-04-30 (×4): 10 mg via INTRAVENOUS

## 2014-04-30 MED ORDER — ROCURONIUM BROMIDE 50 MG/5ML IV SOLN
INTRAVENOUS | Status: AC
  Filled 2014-04-30: qty 1

## 2014-04-30 MED ORDER — LIDOCAINE HCL (CARDIAC) 20 MG/ML IV SOLN
INTRAVENOUS | Status: AC
  Filled 2014-04-30: qty 5

## 2014-04-30 MED ORDER — ROPIVACAINE HCL 5 MG/ML IJ SOLN
INTRAMUSCULAR | Status: DC | PRN
  Administered 2014-04-30: 25 mL via PERINEURAL

## 2014-04-30 MED ORDER — LIDOCAINE HCL (CARDIAC) 20 MG/ML IV SOLN
INTRAVENOUS | Status: DC | PRN
  Administered 2014-04-30: 80 mg via INTRAVENOUS

## 2014-04-30 MED ORDER — OXYCODONE HCL 5 MG PO TABS
ORAL_TABLET | ORAL | Status: AC
  Administered 2014-04-30: 5 mg
  Filled 2014-04-30: qty 1

## 2014-04-30 MED ORDER — PROPOFOL 10 MG/ML IV BOLUS
INTRAVENOUS | Status: DC | PRN
  Administered 2014-04-30: 160 mg via INTRAVENOUS

## 2014-04-30 MED ORDER — FENTANYL CITRATE 0.05 MG/ML IJ SOLN
INTRAMUSCULAR | Status: AC
  Filled 2014-04-30: qty 5

## 2014-04-30 MED ORDER — ONDANSETRON HCL 4 MG/2ML IJ SOLN
INTRAMUSCULAR | Status: DC | PRN
  Administered 2014-04-30: 4 mg via INTRAVENOUS

## 2014-04-30 MED ORDER — GLYCOPYRROLATE 0.2 MG/ML IJ SOLN
INTRAMUSCULAR | Status: DC | PRN
  Administered 2014-04-30: 0.2 mg via INTRAVENOUS

## 2014-04-30 MED ORDER — MIDAZOLAM HCL 2 MG/2ML IJ SOLN
INTRAMUSCULAR | Status: AC
  Administered 2014-04-30: 2 mg
  Filled 2014-04-30: qty 2

## 2014-04-30 MED ORDER — PROPOFOL 10 MG/ML IV BOLUS
INTRAVENOUS | Status: AC
  Filled 2014-04-30: qty 20

## 2014-04-30 MED ORDER — MIDAZOLAM HCL 2 MG/2ML IJ SOLN
INTRAMUSCULAR | Status: AC
  Filled 2014-04-30: qty 2

## 2014-04-30 MED ORDER — 0.9 % SODIUM CHLORIDE (POUR BTL) OPTIME
TOPICAL | Status: DC | PRN
  Administered 2014-04-30: 1000 mL

## 2014-04-30 MED ORDER — FENTANYL CITRATE 0.05 MG/ML IJ SOLN
INTRAMUSCULAR | Status: DC | PRN
  Administered 2014-04-30 (×2): 25 ug via INTRAVENOUS

## 2014-04-30 MED ORDER — FENTANYL CITRATE 0.05 MG/ML IJ SOLN
INTRAMUSCULAR | Status: AC
  Administered 2014-04-30: 50 ug
  Filled 2014-04-30: qty 2

## 2014-04-30 MED ORDER — ONDANSETRON HCL 4 MG/2ML IJ SOLN
INTRAMUSCULAR | Status: AC
  Filled 2014-04-30: qty 2

## 2014-04-30 MED ORDER — LACTATED RINGERS IV SOLN
INTRAVENOUS | Status: DC
  Administered 2014-04-30 (×2): via INTRAVENOUS

## 2014-04-30 MED ORDER — HYDROMORPHONE HCL PF 1 MG/ML IJ SOLN
INTRAMUSCULAR | Status: AC
  Administered 2014-04-30: 0.5 mg
  Filled 2014-04-30: qty 1

## 2014-04-30 SURGICAL SUPPLY — 57 items
APL SKNCLS STERI-STRIP NONHPOA (GAUZE/BANDAGES/DRESSINGS) ×1
BENZOIN TINCTURE PRP APPL 2/3 (GAUZE/BANDAGES/DRESSINGS) ×3 IMPLANT
BLADE AVERAGE 25MMX9MM (BLADE)
BLADE AVERAGE 25X9 (BLADE) IMPLANT
CLOSURE STERI-STRIP 1/2X4 (GAUZE/BANDAGES/DRESSINGS) ×1
CLOSURE WOUND 1/4X4 (GAUZE/BANDAGES/DRESSINGS)
CLSR STERI-STRIP ANTIMIC 1/2X4 (GAUZE/BANDAGES/DRESSINGS) ×1 IMPLANT
COVER SURGICAL LIGHT HANDLE (MISCELLANEOUS) ×6 IMPLANT
DRAPE INCISE IOBAN 66X45 STRL (DRAPES) ×3 IMPLANT
DRAPE OEC MINIVIEW 54X84 (DRAPES) ×2 IMPLANT
DRAPE ORTHO SPLIT 77X108 STRL (DRAPES)
DRAPE SURG ORHT 6 SPLT 77X108 (DRAPES) ×1 IMPLANT
DRAPE U-SHAPE 47X51 STRL (DRAPES) ×6 IMPLANT
DRSG MEPILEX BORDER 4X4 (GAUZE/BANDAGES/DRESSINGS) ×2 IMPLANT
DRSG PAD ABDOMINAL 8X10 ST (GAUZE/BANDAGES/DRESSINGS) ×1 IMPLANT
DURAPREP 26ML APPLICATOR (WOUND CARE) ×3 IMPLANT
ELECT CAUTERY BLADE 6.4 (BLADE) ×3 IMPLANT
ELECT REM PT RETURN 9FT ADLT (ELECTROSURGICAL) ×3
ELECTRODE REM PT RTRN 9FT ADLT (ELECTROSURGICAL) ×1 IMPLANT
GAUZE XEROFORM 1X8 LF (GAUZE/BANDAGES/DRESSINGS) ×1 IMPLANT
GLOVE BIO SURGEON ST LM GN SZ9 (GLOVE) ×3 IMPLANT
GLOVE BIOGEL PI IND STRL 9 (GLOVE) ×1 IMPLANT
GLOVE BIOGEL PI INDICATOR 9 (GLOVE) ×2
GOWN STRL REUS W/ TWL XL LVL3 (GOWN DISPOSABLE) ×3 IMPLANT
GOWN STRL REUS W/TWL 2XL LVL3 (GOWN DISPOSABLE) ×4 IMPLANT
GOWN STRL REUS W/TWL XL LVL3 (GOWN DISPOSABLE) ×3
KIT BASIN OR (CUSTOM PROCEDURE TRAY) ×3 IMPLANT
KIT BIO-TENODESIS 3X8 DISP (MISCELLANEOUS) ×3
KIT INSRT BABSR STRL DISP BTN (MISCELLANEOUS) IMPLANT
KIT REPAIR AC JOINT TIGHT (Orthopedic Implant) ×2 IMPLANT
KIT ROOM TURNOVER OR (KITS) ×3 IMPLANT
MANIFOLD NEPTUNE II (INSTRUMENTS) ×3 IMPLANT
NDL 1/2 CIR CATGUT .05X1.09 (NEEDLE) IMPLANT
NDL HYPO 25GX1X1/2 BEV (NEEDLE) IMPLANT
NEEDLE 1/2 CIR CATGUT .05X1.09 (NEEDLE) IMPLANT
NEEDLE HYPO 25GX1X1/2 BEV (NEEDLE) IMPLANT
NS IRRIG 1000ML POUR BTL (IV SOLUTION) ×3 IMPLANT
PACK SHOULDER (CUSTOM PROCEDURE TRAY) ×3 IMPLANT
PAD ARMBOARD 7.5X6 YLW CONV (MISCELLANEOUS) ×6 IMPLANT
SLING ARM IMMOBILIZER LRG (SOFTGOODS) ×2 IMPLANT
SPONGE GAUZE 4X4 12PLY (GAUZE/BANDAGES/DRESSINGS) ×1 IMPLANT
SPONGE LAP 4X18 X RAY DECT (DISPOSABLE) ×2 IMPLANT
STAPLER VISISTAT 35W (STAPLE) IMPLANT
STRIP CLOSURE SKIN 1/4X4 (GAUZE/BANDAGES/DRESSINGS) ×1 IMPLANT
SUCTION FRAZIER TIP 10 FR DISP (SUCTIONS) ×3 IMPLANT
SUT ETHIBOND NAB BRD #0 18IN (SUTURE) ×1 IMPLANT
SUT MON AB 2-0 CT1 36 (SUTURE) ×2 IMPLANT
SUT MON AB 3-0 SH 27 (SUTURE) ×3
SUT MON AB 3-0 SH27 (SUTURE) IMPLANT
SUT VIC AB 2-0 CT1 27 (SUTURE) ×3
SUT VIC AB 2-0 CT1 TAPERPNT 27 (SUTURE) IMPLANT
SUT VICRYL 4-0 PS2 18IN ABS (SUTURE) ×2 IMPLANT
SUT VICRYL AB 2 0 TIES (SUTURE) ×1 IMPLANT
SYR CONTROL 10ML LL (SYRINGE) IMPLANT
TOWEL OR 17X24 6PK STRL BLUE (TOWEL DISPOSABLE) ×3 IMPLANT
TOWEL OR 17X26 10 PK STRL BLUE (TOWEL DISPOSABLE) ×3 IMPLANT
WATER STERILE IRR 1000ML POUR (IV SOLUTION) ×3 IMPLANT
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
## 2014-04-30 NOTE — Transfer of Care (Signed)
Immediate Anesthesia Transfer of Care Note  Patient: Kyle Glass  Procedure(s) Performed: Procedure(s) with comments: ACROMIO-CLAVICULAR JOINT REPAIR (Left) - Left Shoulder Acromioclavicular Joint Repair  Patient Location: PACU  Anesthesia Type:GA combined with regional for post-op pain  Level of Consciousness: awake, alert , oriented and patient cooperative  Airway & Oxygen Therapy: Patient Spontanous Breathing and Patient connected to nasal cannula oxygen  Post-op Assessment: Report given to PACU RN, Post -op Vital signs reviewed and stable and Patient moving all extremities  Post vital signs: Reviewed and stable  Complications: No apparent anesthesia complications

## 2014-04-30 NOTE — Anesthesia Preprocedure Evaluation (Addendum)
Anesthesia Evaluation  Patient identified by MRN, date of birth, ID band Patient awake    Reviewed: Allergy & Precautions, H&P , NPO status , Patient's Chart, lab work & pertinent test results  History of Anesthesia Complications Negative for: history of anesthetic complications  Airway Mallampati: II TM Distance: >3 FB Neck ROM: Full    Dental  (+) Teeth Intact   Pulmonary neg sleep apnea, neg COPDformer smoker,  breath sounds clear to auscultation        Cardiovascular negative cardio ROS  Rhythm:Regular     Neuro/Psych negative neurological ROS  negative psych ROS   GI/Hepatic negative GI ROS, Neg liver ROS,   Endo/Other  Hypothyroidism   Renal/GU negative Renal ROS     Musculoskeletal   Abdominal   Peds  Hematology negative hematology ROS (+)   Anesthesia Other Findings   Reproductive/Obstetrics                          Anesthesia Physical Anesthesia Plan  ASA: II  Anesthesia Plan: General and Regional   Post-op Pain Management:    Induction: Intravenous  Airway Management Planned: LMA and Oral ETT  Additional Equipment: None  Intra-op Plan:   Post-operative Plan: Extubation in OR  Informed Consent: I have reviewed the patients History and Physical, chart, labs and discussed the procedure including the risks, benefits and alternatives for the proposed anesthesia with the patient or authorized representative who has indicated his/her understanding and acceptance.   Dental advisory given  Plan Discussed with: CRNA and Surgeon  Anesthesia Plan Comments:         Anesthesia Quick Evaluation

## 2014-04-30 NOTE — Discharge Instructions (Signed)
Wear a sling at all times. Elevation and ice to left shoulder.

## 2014-04-30 NOTE — Anesthesia Postprocedure Evaluation (Signed)
  Anesthesia Post-op Note  Patient: Kyle Glass  Procedure(s) Performed: Procedure(s) with comments: ACROMIO-CLAVICULAR JOINT REPAIR (Left) - Left Shoulder Acromioclavicular Joint Repair  Patient Location: PACU  Anesthesia Type:General and Regional  Level of Consciousness: awake and alert   Airway and Oxygen Therapy: Patient Spontanous Breathing  Post-op Pain: mild  Post-op Assessment: Post-op Vital signs reviewed, Patient's Cardiovascular Status Stable, Respiratory Function Stable, Patent Airway, No signs of Nausea or vomiting and Pain level controlled  Post-op Vital Signs: Reviewed and stable  Last Vitals:  Filed Vitals:   04/30/14 1615  BP:   Pulse: 60  Temp: 35.6 C  Resp: 18    Complications: No apparent anesthesia complications

## 2014-04-30 NOTE — Op Note (Signed)
04/30/2014  3:05 PM  PATIENT:  Kyle Glass    PRE-OPERATIVE DIAGNOSIS:  Left AC Joint Separation  POST-OPERATIVE DIAGNOSIS:  Same  PROCEDURE:  ACROMIO-CLAVICULAR JOINT REPAIR  SURGEON:  Newt Minion, MD  PHYSICIAN ASSISTANT:None ANESTHESIA:   General  PREOPERATIVE INDICATIONS:  [name] is a  58 y.o. male with a diagnosis of Left AC Joint Separation who failed conservative measures and elected for surgical management.    The risks benefits and alternatives were discussed with the patient preoperatively including but not limited to the risks of infection, bleeding, nerve injury, cardiopulmonary complications, the need for revision surgery, among others, and the patient was willing to proceed.  OPERATIVE IMPLANTS: Tightrope implant  OPERATIVE FINDINGS: Complete rupture of the coracoacromial ligaments  OPERATIVE PROCEDURE: Patient was brought to the operating room and underwent a general anesthetic.  After adequate levels of anesthesia were obtained the patient was placed in the beach chair position and the left upper extremity was prepped using DuraPrep draped into a sterile field.  A timeout was called.  An incision was made from the clavicle to the coracoid.  Blunt dissection was carried down through the clavicle and coracoid.  A guidewire was inserted through the clavicle and this was overdrilled with a 4.5 mm drill.  A guidewire was inserted into the coracoid and this was overdrilled with a 4.5 mm drill.  Care was taken to protect all structures behind the coracoid.  A suture passer was then placed through both holes in the tight group anchor was placed through both holes.  The anchor was flipped and secured.  The or and was elevated the clavicle was depressed and the tight rope was secured.  Fascial layer was closed using 2-0 Vicryl subcutaneous was closed using Monocryl.  A Mepilex dressing was applied patient was placed in a sling extubated and taken to the PACU in stable condition  plan for discharge to home

## 2014-04-30 NOTE — Anesthesia Procedure Notes (Addendum)
Procedure Name: LMA Insertion Date/Time: 04/30/2014 2:19 PM Performed by: Julian Reil Pre-anesthesia Checklist: Patient identified, Emergency Drugs available, Suction available and Patient being monitored Patient Re-evaluated:Patient Re-evaluated prior to inductionOxygen Delivery Method: Circle system utilized Preoxygenation: Pre-oxygenation with 100% oxygen Intubation Type: IV induction LMA: LMA inserted LMA Size: 4.0 Tube type: Oral Number of attempts: 1 Placement Confirmation: positive ETCO2 and breath sounds checked- equal and bilateral Tube secured with: Tape Dental Injury: Teeth and Oropharynx as per pre-operative assessment    Anesthesia Regional Block:  Interscalene brachial plexus block  Pre-Anesthetic Checklist: ,, timeout performed, Correct Patient, Correct Site, Correct Laterality, Correct Procedure, Correct Position, site marked, Risks and benefits discussed,  Surgical consent,  Pre-op evaluation,  At surgeon's request and post-op pain management  Laterality: Upper and Left  Prep: chloraprep       Needles:  Injection technique: Single-shot  Needle Type: Echogenic Stimulator Needle          Additional Needles:  Procedures: ultrasound guided (picture in chart) and nerve stimulator Interscalene brachial plexus block  Nerve Stimulator or Paresthesia:  Response: deltoid, 0.2 mA,   Additional Responses:   Narrative:  Start time: 04/30/2014 1:43 PM End time: 04/30/2014 1:52 PM Injection made incrementally with aspirations every 5 mL.  Performed by: Personally  Anesthesiologist: Kiva Norland  Additional Notes: H+P and labs reviewed, risks and benefits discussed with patient, procedure tolerated well without complications

## 2014-05-03 ENCOUNTER — Encounter (HOSPITAL_COMMUNITY): Payer: Self-pay | Admitting: Orthopedic Surgery

## 2018-08-11 MED FILL — valACYclovir HCL 1 GM TABS: 1 | 8 days supply | Qty: 32 | Fill #0

## 2018-08-28 MED FILL — DUTASTERIDE 0.5 MG CAPS: 0.5 | 180 days supply | Qty: 90 | Fill #0

## 2018-10-09 MED FILL — TAMSULOSIN HCL 0.4 MG CAP: 0.4 | 90 days supply | Qty: 90 | Fill #0

## 2018-11-27 MED FILL — DUTASTERIDE 0.5 MG CAPS: 0.5 | 90 days supply | Qty: 90 | Fill #0

## 2019-01-08 MED FILL — TAMSULOSIN HCL 0.4 MG CAP: 0.4 | 90 days supply | Qty: 90 | Fill #1

## 2019-02-23 MED FILL — DUTASTERIDE 0.5 MG CAPS: 0.5 | 90 days supply | Qty: 90 | Fill #1

## 2019-04-07 MED FILL — TAMSULOSIN HCL 0.4 MG CAP: 0.4 | 90 days supply | Qty: 90 | Fill #2

## 2019-07-06 MED FILL — TAMSULOSIN HCL 0.4 MG CAP: 0.4 | 90 days supply | Qty: 90 | Fill #0

## 2019-10-06 MED FILL — TAMSULOSIN HCL 0.4 MG CAP: 0.4 | 90 days supply | Qty: 90 | Fill #1

## 2020-01-06 MED FILL — TAMSULOSIN HCL 0.4 MG CAP: 0.4 | 90 days supply | Qty: 90 | Fill #2

## 2020-04-25 DIAGNOSIS — I10 Essential (primary) hypertension: Secondary | ICD-10-CM | POA: Diagnosis not present

## 2020-04-25 DIAGNOSIS — Z0000 Encounter for general adult medical examination without abnormal findings: Secondary | ICD-10-CM | POA: Diagnosis not present

## 2020-04-25 DIAGNOSIS — E038 Other specified hypothyroidism: Secondary | ICD-10-CM | POA: Diagnosis not present

## 2020-04-25 DIAGNOSIS — Z125 Encounter for screening for malignant neoplasm of prostate: Secondary | ICD-10-CM | POA: Diagnosis not present

## 2020-05-02 ENCOUNTER — Telehealth: Payer: Self-pay | Admitting: Orthopedic Surgery

## 2020-05-02 DIAGNOSIS — E039 Hypothyroidism, unspecified: Secondary | ICD-10-CM | POA: Diagnosis not present

## 2020-05-02 DIAGNOSIS — Z0000 Encounter for general adult medical examination without abnormal findings: Secondary | ICD-10-CM | POA: Diagnosis not present

## 2020-05-02 DIAGNOSIS — Z1212 Encounter for screening for malignant neoplasm of rectum: Secondary | ICD-10-CM | POA: Diagnosis not present

## 2020-05-02 DIAGNOSIS — R82998 Other abnormal findings in urine: Secondary | ICD-10-CM | POA: Diagnosis not present

## 2020-05-02 DIAGNOSIS — I10 Essential (primary) hypertension: Secondary | ICD-10-CM | POA: Diagnosis not present

## 2020-05-02 DIAGNOSIS — H348192 Central retinal vein occlusion, unspecified eye, stable: Secondary | ICD-10-CM | POA: Diagnosis not present

## 2020-05-02 NOTE — Telephone Encounter (Signed)
Received call from pt inquiring if Dr. Sharol Given gave him Tetnus prior to his surgery 2015. I checked his [facility] records, also verified with Autumn that tetnus was not given at our office.

## 2020-06-07 DIAGNOSIS — L814 Other melanin hyperpigmentation: Secondary | ICD-10-CM | POA: Diagnosis not present

## 2020-06-07 DIAGNOSIS — Z85828 Personal history of other malignant neoplasm of skin: Secondary | ICD-10-CM | POA: Diagnosis not present

## 2020-06-07 DIAGNOSIS — L821 Other seborrheic keratosis: Secondary | ICD-10-CM | POA: Diagnosis not present

## 2020-06-07 DIAGNOSIS — D225 Melanocytic nevi of trunk: Secondary | ICD-10-CM | POA: Diagnosis not present

## 2020-06-20 ENCOUNTER — Encounter (INDEPENDENT_AMBULATORY_CARE_PROVIDER_SITE_OTHER): Payer: Self-pay | Admitting: Ophthalmology

## 2020-06-21 DIAGNOSIS — Z0120 Encounter for dental examination and cleaning without abnormal findings: Secondary | ICD-10-CM | POA: Diagnosis not present

## 2020-08-27 DIAGNOSIS — Z23 Encounter for immunization: Secondary | ICD-10-CM | POA: Diagnosis not present

## 2021-02-13 ENCOUNTER — Ambulatory Visit (INDEPENDENT_AMBULATORY_CARE_PROVIDER_SITE_OTHER): Payer: Medicare Other | Admitting: Ophthalmology

## 2021-02-13 ENCOUNTER — Other Ambulatory Visit: Payer: Self-pay

## 2021-02-13 ENCOUNTER — Encounter (INDEPENDENT_AMBULATORY_CARE_PROVIDER_SITE_OTHER): Payer: Self-pay | Admitting: Ophthalmology

## 2021-02-13 DIAGNOSIS — H353124 Nonexudative age-related macular degeneration, left eye, advanced atrophic with subfoveal involvement: Secondary | ICD-10-CM | POA: Diagnosis not present

## 2021-02-13 DIAGNOSIS — H348122 Central retinal vein occlusion, left eye, stable: Secondary | ICD-10-CM | POA: Diagnosis not present

## 2021-02-13 DIAGNOSIS — H2513 Age-related nuclear cataract, bilateral: Secondary | ICD-10-CM | POA: Insufficient documentation

## 2021-02-13 DIAGNOSIS — H348112 Central retinal vein occlusion, right eye, stable: Secondary | ICD-10-CM | POA: Insufficient documentation

## 2021-02-13 DIAGNOSIS — H4720 Unspecified optic atrophy: Secondary | ICD-10-CM

## 2021-02-13 NOTE — Assessment & Plan Note (Signed)
Compensated disease, never had ischemic disease yet with now collaterals has been stable like this appearance for 24 years

## 2021-02-13 NOTE — Progress Notes (Signed)
02/13/2021     CHIEF COMPLAINT Patient presents for Retina Follow Up (1.5 Year F/U OU//Pt reports occasional flash of light OU "in crazy colors" when going from darker lighting to "real bright" light. [facility]. Pt denies ocular pain OU.)   HISTORY OF PRESENT ILLNESS: Kyle Glass is a 65 y.o. male who presents to the clinic today for:   HPI    Retina Follow Up    Patient presents with  Other.  In both eyes.  This started 2 years ago.  Severity is mild.  Duration of 2 years.  Since onset it is stable. Additional comments: 1.5 Year F/U OU  Pt reports occasional flash of light OU "in crazy colors" when going from darker lighting to "real bright" light. [facility]. Pt denies ocular pain OU.          Comments    History of CRV O, treated with PRP elsewhere, prior to [place], developed Early CRV O in the mid to late 90s and has subsequently developed compensated disease, no active disease with collaterals on the nerve       Last edited by Kyle Horn, MD on 02/13/2021 10:00 AM. (History)      Referring physician: Burnard Bunting, MD [place],  [place] [phone]  HISTORICAL INFORMATION:   Selected notes from the [place]: Current Outpatient Medications (Ophthalmic Drugs)  Medication Sig  . timolol (TIMOPTIC-XR) 0.5 % ophthalmic gel-forming Place 1 drop into both eyes every evening.   No current facility-administered medications for this visit. (Ophthalmic Drugs)   Current Outpatient Medications (Other)  Medication Sig  . aspirin EC 81 MG tablet Take 81 mg by mouth daily.  Marland Kitchen b complex vitamins tablet Take 1 tablet by mouth daily.  . cyclobenzaprine (FLEXERIL) 10 MG tablet Take 10 mg by mouth 3 (three) times daily as needed for muscle spasms.  . Glucosamine-Chondroit-Vit C-Mn (GLUCOSAMINE 1500 COMPLEX) CAPS Take 1 capsule by mouth daily.  Marland Kitchen HYDROcodone-acetaminophen (NORCO/VICODIN) 5-325 MG per  tablet Take 1 tablet by mouth every 4 (four) hours as needed for moderate pain.  Marland Kitchen levothyroxine (SYNTHROID, LEVOTHROID) 50 MCG tablet Take 50 mcg by mouth daily before breakfast.  . mupirocin ointment (BACTROBAN) 2 % Place 1 application into the nose 2 (two) times daily. Apply to affected area  . Omega-3 Fatty Acids (FISH OIL) 1200 MG CAPS Take 1,200 mg by mouth daily.  . Probiotic Product (PROBIOTIC ACIDOPHILUS) CAPS Take 1 capsule by mouth daily.  . tamsulosin (FLOMAX) 0.4 MG CAPS capsule Take 0.4 mg by mouth every evening.  . valACYclovir (VALTREX) 1000 MG tablet Take 2,000 mg by mouth 2 (two) times daily as needed (for outbreaks). Take for 1 day only during outbreaks   No current facility-administered medications for this visit. (Other)      REVIEW OF SYSTEMS:    ALLERGIES No Known Allergies  PAST MEDICAL HISTORY Past Medical History:  Diagnosis Date  . Broken ankle   . Cancer ([place])    basel  celll  off nose  . Chronic kidney disease    stones, slow stream  . Hypothyroidism    Past Surgical History:  Procedure Laterality Date  . ACROMIO-CLAVICULAR JOINT REPAIR Left 04/30/2014   Procedure: ACROMIO-CLAVICULAR JOINT REPAIR;  Surgeon: Newt Minion, MD;  Location: [place] [place];  Service: Orthopedics;  Laterality: Left;  Left Shoulder Acromioclavicular Joint Repair  .  CHOLECYSTECTOMY    . HERNIA REPAIR     x2  . JOINT REPLACEMENT     hip  . testicular torsion      FAMILY HISTORY History reviewed. No pertinent family history.  SOCIAL HISTORY Social History   Tobacco Use  . Smoking status: Former Research scientist (life sciences)  . Smokeless tobacco: Never Used  Substance Use Topics  . Alcohol use: Yes    Comment: occ  . Drug use: No         OPHTHALMIC EXAM: Base Eye Exam    Visual Acuity (ETDRS)      Right Left   Dist [place] 20/20 -2 20/400   Dist ph [place]  NI       Tonometry (Tonopen, 8:47 AM)      Right Left   Pressure 21 11       Pupils      Dark Light Shape React APD   Right 4.5  3.5 Round Slow None   Left 3.5 2.5 Round Slow +1       Visual Fields (Counting fingers)      Left Right     Full   Restrictions Total superior temporal, inferior temporal, superior nasal, inferior nasal deficiencies        Extraocular Movement      Right Left    Full Full       Neuro/Psych    Oriented x3: Yes   Mood/Affect: Normal       Dilation    Both eyes: 1.0% Mydriacyl, 2.5% Phenylephrine @ 8:51 AM        Slit Lamp and Fundus Exam    External Exam      Right Left   External Normal Normal       Slit Lamp Exam      Right Left   Lids/Lashes Normal Normal   Conjunctiva/Sclera White and quiet White and quiet   Cornea Clear Clear   Anterior Chamber Deep and quiet Deep and quiet   Iris Round and reactive Round and reactive   Lens Nuclear sclerosis Nuclear sclerosis   Anterior Vitreous Normal Normal       Fundus Exam      Right Left   Posterior Vitreous Posterior vitreous detachment Normal   Disc Collaterals on the nerve 4+ Pallor   C/D Ratio 0.45 0.85   Macula Normal Geographic atrophy   Vessels Normal Old CRV O, thin attenuated arteries and veins   Periphery Normal Good PRP, no active disease          IMAGING AND PROCEDURES  Imaging and Procedures for 02/13/21           ASSESSMENT/PLAN:  Nuclear sclerotic cataract of both eyes Stable OU not visually significant  Central retinal vein occlusion of left eye Quiet CRV O, ischemic, treated elsewhere Dr. Mylinda Latina, prior to 1995 stable  Advanced nonexudative age-related macular degeneration of left eye with subfoveal involvement Atrophy in the macula OS largely from prior CME which has resolved  Stable central retinal vein occlusion of right eye Compensated disease, never had ischemic disease yet with now collaterals has been stable like this appearance for 24 years      ICD-10-CM   1. Stable central retinal vein occlusion of left eye  H34.8122   2. Advanced nonexudative age-related  macular degeneration of left eye with subfoveal involvement  H35.3124   3. OA (optic atrophy)  H47.20   4. Nuclear sclerotic cataract of both eyes  H25.13   5. Stable  central retinal vein occlusion of right eye  H34.8112     1.  OU, with no recurrence of CRV O.  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  2.  Patient continues to use safety glasses to protect the right eye from trauma  3.  Ophthalmic Meds Ordered this visit:  No orders of the defined types were placed in this encounter.      Return in about 1 year (around 02/13/2022) for DILATE OU, COLOR FP, OCT.  There are no Patient Instructions on file for this visit.   Explained the diagnoses, plan, and follow up with the patient and they expressed understanding.  Patient expressed understanding of the importance of proper follow up care.   Clent Demark Shainna Faux M.D. Diseases & Surgery of the Retina and Vitreous [facility] [place] 02/13/21     Abbreviations: M myopia (nearsighted); A astigmatism; H hyperopia (farsighted); P presbyopia; Mrx spectacle prescription;  CTL contact lenses; OD right eye; OS left eye; OU both eyes  XT exotropia; ET esotropia; PEK punctate epithelial keratitis; PEE punctate epithelial erosions; DES dry eye syndrome; MGD meibomian gland dysfunction; ATs artificial tears; PFAT's preservative free artificial tears; [place] nuclear sclerotic cataract; PSC posterior subcapsular cataract; ERM epi-retinal membrane; PVD posterior vitreous detachment; RD retinal detachment; DM diabetes mellitus; DR diabetic retinopathy; NPDR non-proliferative diabetic retinopathy; PDR proliferative diabetic retinopathy; CSME clinically significant macular edema; DME diabetic macular edema; dbh dot blot hemorrhages; CWS cotton wool spot; POAG primary open angle glaucoma; C/D cup-to-disc ratio; HVF  humphrey visual field; GVF goldmann visual field; OCT optical coherence tomography; IOP intraocular pressure; BRVO Branch retinal vein occlusion; CRVO central retinal vein occlusion; CRAO central retinal artery occlusion; BRAO branch retinal artery occlusion; RT retinal tear; SB scleral buckle; PPV pars plana vitrectomy; VH Vitreous hemorrhage; PRP panretinal laser photocoagulation; IVK intravitreal kenalog; VMT vitreomacular traction; MH Macular hole;  NVD neovascularization of the disc; NVE neovascularization elsewhere; AREDS age related eye disease study; ARMD age related macular degeneration; POAG primary open angle glaucoma; EBMD epithelial/anterior basement membrane dystrophy; ACIOL anterior chamber intraocular lens; IOL intraocular lens; PCIOL posterior chamber intraocular lens; Phaco/IOL phacoemulsification with intraocular lens placement; [place] photorefractive keratectomy; LASIK laser assisted in situ keratomileusis; HTN hypertension; DM diabetes mellitus; COPD chronic obstructive pulmonary disease

## 2021-02-13 NOTE — Assessment & Plan Note (Signed)
Stable OU not visually significant

## 2021-02-13 NOTE — Assessment & Plan Note (Signed)
Atrophy in the macula OS largely from prior CME which has resolved

## 2021-02-13 NOTE — Assessment & Plan Note (Signed)
Quiet CRV O, ischemic, treated elsewhere Dr. Mylinda Latina, prior to 1995 stable

## 2021-05-01 DIAGNOSIS — I10 Essential (primary) hypertension: Secondary | ICD-10-CM | POA: Diagnosis not present

## 2021-05-01 DIAGNOSIS — Z125 Encounter for screening for malignant neoplasm of prostate: Secondary | ICD-10-CM | POA: Diagnosis not present

## 2021-05-01 DIAGNOSIS — E039 Hypothyroidism, unspecified: Secondary | ICD-10-CM | POA: Diagnosis not present

## 2021-05-01 DIAGNOSIS — Z0000 Encounter for general adult medical examination without abnormal findings: Secondary | ICD-10-CM | POA: Diagnosis not present

## 2021-05-08 DIAGNOSIS — I10 Essential (primary) hypertension: Secondary | ICD-10-CM | POA: Diagnosis not present

## 2021-05-08 DIAGNOSIS — Z1212 Encounter for screening for malignant neoplasm of rectum: Secondary | ICD-10-CM | POA: Diagnosis not present

## 2021-05-08 DIAGNOSIS — N390 Urinary tract infection, site not specified: Secondary | ICD-10-CM | POA: Diagnosis not present

## 2021-05-08 DIAGNOSIS — R82998 Other abnormal findings in urine: Secondary | ICD-10-CM | POA: Diagnosis not present

## 2021-06-12 DIAGNOSIS — Z85828 Personal history of other malignant neoplasm of skin: Secondary | ICD-10-CM | POA: Diagnosis not present

## 2021-06-12 DIAGNOSIS — D1801 Hemangioma of skin and subcutaneous tissue: Secondary | ICD-10-CM | POA: Diagnosis not present

## 2021-06-12 DIAGNOSIS — L821 Other seborrheic keratosis: Secondary | ICD-10-CM | POA: Diagnosis not present

## 2021-06-12 DIAGNOSIS — D2239 Melanocytic nevi of other parts of face: Secondary | ICD-10-CM | POA: Diagnosis not present

## 2021-06-18 DIAGNOSIS — G252 Other specified forms of tremor: Secondary | ICD-10-CM | POA: Diagnosis not present

## 2021-06-18 DIAGNOSIS — E039 Hypothyroidism, unspecified: Secondary | ICD-10-CM | POA: Diagnosis not present

## 2021-06-18 DIAGNOSIS — I10 Essential (primary) hypertension: Secondary | ICD-10-CM | POA: Diagnosis not present

## 2021-09-18 DIAGNOSIS — I10 Essential (primary) hypertension: Secondary | ICD-10-CM | POA: Diagnosis not present

## 2021-09-18 DIAGNOSIS — G252 Other specified forms of tremor: Secondary | ICD-10-CM | POA: Diagnosis not present

## 2021-09-18 DIAGNOSIS — E039 Hypothyroidism, unspecified: Secondary | ICD-10-CM | POA: Diagnosis not present

## 2021-12-14 ENCOUNTER — Other Ambulatory Visit (INDEPENDENT_AMBULATORY_CARE_PROVIDER_SITE_OTHER): Payer: Self-pay | Admitting: Ophthalmology

## 2021-12-15 DIAGNOSIS — R059 Cough, unspecified: Secondary | ICD-10-CM | POA: Diagnosis not present

## 2021-12-15 DIAGNOSIS — J019 Acute sinusitis, unspecified: Secondary | ICD-10-CM | POA: Diagnosis not present

## 2021-12-15 DIAGNOSIS — I10 Essential (primary) hypertension: Secondary | ICD-10-CM | POA: Diagnosis not present

## 2021-12-15 DIAGNOSIS — R0981 Nasal congestion: Secondary | ICD-10-CM | POA: Diagnosis not present

## 2021-12-17 DIAGNOSIS — N401 Enlarged prostate with lower urinary tract symptoms: Secondary | ICD-10-CM | POA: Diagnosis not present

## 2021-12-17 DIAGNOSIS — I10 Essential (primary) hypertension: Secondary | ICD-10-CM | POA: Diagnosis not present

## 2021-12-17 DIAGNOSIS — E039 Hypothyroidism, unspecified: Secondary | ICD-10-CM | POA: Diagnosis not present

## 2022-02-13 ENCOUNTER — Other Ambulatory Visit: Payer: Self-pay

## 2022-02-13 ENCOUNTER — Ambulatory Visit (INDEPENDENT_AMBULATORY_CARE_PROVIDER_SITE_OTHER): Payer: Medicare Other | Admitting: Ophthalmology

## 2022-02-13 ENCOUNTER — Encounter (INDEPENDENT_AMBULATORY_CARE_PROVIDER_SITE_OTHER): Payer: Self-pay | Admitting: Ophthalmology

## 2022-02-13 DIAGNOSIS — H348112 Central retinal vein occlusion, right eye, stable: Secondary | ICD-10-CM | POA: Diagnosis not present

## 2022-02-13 DIAGNOSIS — H348122 Central retinal vein occlusion, left eye, stable: Secondary | ICD-10-CM | POA: Diagnosis not present

## 2022-02-13 DIAGNOSIS — H2513 Age-related nuclear cataract, bilateral: Secondary | ICD-10-CM | POA: Diagnosis not present

## 2022-02-13 DIAGNOSIS — H353124 Nonexudative age-related macular degeneration, left eye, advanced atrophic with subfoveal involvement: Secondary | ICD-10-CM | POA: Diagnosis not present

## 2022-02-13 NOTE — Assessment & Plan Note (Signed)
Compensated OS previous ischemic disease, loss of optic nerve perfusion, optic atrophy and previous neovascular disease treated with PRP elsewhere, prior to 30 years ago ?

## 2022-02-13 NOTE — Progress Notes (Signed)
? ? ?02/13/2022 ? ?  ? ?CHIEF COMPLAINT ?Patient presents for  ?Chief Complaint  ?Patient presents with  ? Retina Follow Up  ? ? ? ? ?HISTORY OF PRESENT ILLNESS: ?Kyle Glass is a 66 y.o. male who presents to the clinic today for:  ? ?HPI   ? ? Retina Follow Up   ? ?      ? Diagnosis: CRVO/BRVO  ? Laterality: left eye  ? Onset: 1 year ago  ? Course: stable  ? ?  ?  ? ? Comments   ?1 yr fu OU oct fp. ?Patient states vision is stable and unchanged since last visit. Denies any new floaters or FOL. ?Pt uses Timolol QHS OU. ? ?  ?  ?Last edited by Laurin Coder on 02/13/2022  8:02 AM.  ?  ? ? ?Referring physician: ?Burnard Bunting, MD ?[address] ?[place],  [place] [address] ? ?HISTORICAL INFORMATION:  ? ?Selected notes from the [facility] ?  ?   ? ?CURRENT MEDICATIONS: ?Current Outpatient Medications (Ophthalmic Drugs)  ?Medication Sig  ? timolol (TIMOPTIC) 0.5 % ophthalmic solution USE 1 DROP IN BOTH EYES EVERY MORNING  ? timolol (TIMOPTIC-XR) 0.5 % ophthalmic gel-forming Place 1 drop into both eyes every evening.  ? ?No current facility-administered medications for this visit. (Ophthalmic Drugs)  ? ?Current Outpatient Medications (Other)  ?Medication Sig  ? aspirin EC 81 MG tablet Take 81 mg by mouth daily.  ? b complex vitamins tablet Take 1 tablet by mouth daily.  ? cyclobenzaprine (FLEXERIL) 10 MG tablet Take 10 mg by mouth 3 (three) times daily as needed for muscle spasms.  ? Glucosamine-Chondroit-Vit C-Mn (GLUCOSAMINE 1500 COMPLEX) CAPS Take 1 capsule by mouth daily.  ? HYDROcodone-acetaminophen (NORCO/VICODIN) 5-325 MG per tablet Take 1 tablet by mouth every 4 (four) hours as needed for moderate pain.  ? levothyroxine (SYNTHROID, LEVOTHROID) 50 MCG tablet Take 50 mcg by mouth daily before breakfast.  ? mupirocin ointment (BACTROBAN) 2 % Place 1 application into the nose 2 (two) times daily. Apply to affected area  ? Omega-3 Fatty Acids (FISH OIL) 1200 MG CAPS Take 1,200 mg by mouth daily.  ?  Probiotic Product (PROBIOTIC ACIDOPHILUS) CAPS Take 1 capsule by mouth daily.  ? tamsulosin (FLOMAX) 0.4 MG CAPS capsule Take 0.4 mg by mouth every evening.  ? valACYclovir (VALTREX) 1000 MG tablet Take 2,000 mg by mouth 2 (two) times daily as needed (for outbreaks). Take for 1 day only during outbreaks  ? ?No current facility-administered medications for this visit. (Other)  ? ? ? ? ?REVIEW OF SYSTEMS: ?ROS   ?Negative for: Constitutional, Gastrointestinal, Neurological, Skin, Genitourinary, Musculoskeletal, HENT, Endocrine, Cardiovascular, Eyes, Respiratory, Psychiatric, Allergic/Imm, Heme/Lymph ?Last edited by Hurman Horn, MD on 02/13/2022  8:52 AM.  ?  ? ? ? ?ALLERGIES ?No Known Allergies ? ?PAST MEDICAL HISTORY ?Past Medical History:  ?Diagnosis Date  ? Broken ankle   ? Cancer [facility])   ? basel  celll  off nose  ? Chronic kidney disease   ? stones, slow stream  ? Hypothyroidism   ? ?Past Surgical History:  ?Procedure Laterality Date  ? ACROMIO-CLAVICULAR JOINT REPAIR Left 04/30/2014  ? Procedure: ACROMIO-CLAVICULAR JOINT REPAIR;  Surgeon: Newt Minion, MD;  Location: [facility];  Service: Orthopedics;  Laterality: Left;  Left Shoulder Acromioclavicular Joint Repair  ? CHOLECYSTECTOMY    ? HERNIA REPAIR    ? x2  ? JOINT REPLACEMENT    ? hip  ? testicular torsion    ? ? ?  FAMILY HISTORY ?History reviewed. No pertinent family history. ? ?SOCIAL HISTORY ?Social History  ? ?Tobacco Use  ? Smoking status: Former  ? Smokeless tobacco: Never  ?Substance Use Topics  ? Alcohol use: Yes  ?  Comment: occ  ? Drug use: No  ? ?  ? ?  ? ?OPHTHALMIC EXAM: ? ?Base Eye Exam   ? ? Visual Acuity (ETDRS)   ? ?   Right Left  ? Dist [place] 20/25 -2+2 CF at 3'  ? Dist ph [place]  NI  ? ?  ?  ? ? Tonometry (Tonopen, 8:05 AM)   ? ?   Right Left  ? Pressure 10 8  ? ?  ?  ? ? Pupils   ? ?   Pupils APD  ? Right PERRL None  ? Left PERRL None  ? ?  ?  ? ? Visual Fields (Counting fingers)   ? ?   Left Right  ?   Full  ? Restrictions Partial outer superior  temporal, inferior temporal, superior nasal, inferior nasal deficiencies   ? ?  ?  ? ? Extraocular Movement   ? ?   Right Left  ?  Full Full  ? ?  ?  ? ? Neuro/Psych   ? ? Oriented x3: Yes  ? Mood/Affect: Normal  ? ?  ?  ? ? Dilation   ? ? Both eyes: 1.0% Mydriacyl, 2.5% Phenylephrine @ 8:05 AM  ? ?  ?  ? ?  ? ?Slit Lamp and Fundus Exam   ? ? External Exam   ? ?   Right Left  ? External Normal Normal  ? ?  ?  ? ? Slit Lamp Exam   ? ?   Right Left  ? Lids/Lashes Normal Normal  ? Conjunctiva/Sclera White and quiet White and quiet  ? Cornea Clear Clear  ? Anterior Chamber Deep and quiet Deep and quiet  ? Iris Round and reactive Round and reactive, no NVI  ? Lens 1+ Nuclear sclerosis 1+ Nuclear sclerosis  ? Anterior Vitreous Normal Normal  ? ?  ?  ? ? Fundus Exam   ? ?   Right Left  ? Posterior Vitreous Posterior vitreous detachment Normal mild vitreous debris  ? Disc Collaterals on the nerve 4+ Pallor  ? C/D Ratio 0.45 0.9  ? Macula Normal Geographic atrophy  ? Vessels Normal Old CRV O, thin attenuated arteries and veins  ? Periphery Normal Good PRP, no active disease  ? ?  ?  ? ?  ? ? ?IMAGING AND PROCEDURES  ?Imaging and Procedures for 02/13/22 ? ?OCT, Retina - OU - Both Eyes   ? ?   ?Right Eye ?Quality was good. Scan locations included subfoveal. Central Foveal Thickness: 297. Progression has been stable. Findings include normal foveal contour.  ? ?Left Eye ?Quality was good. Scan locations included subfoveal. Central Foveal Thickness: 223. Progression has been stable. Findings include abnormal foveal contour.  ? ?Notes ?OD normal ? ?OS with diffuse macular atrophy, with temporal CME     residual prior CME From compensated CRV O stable  ? ?  ? ?Color Fundus Photography Optos - OU - Both Eyes   ? ?   ?Right Eye ?Progression has been stable. Disc findings include normal observations. Macula : normal observations. Vessels : normal observations. Periphery : normal observations.  ? ?Left Eye ?Progression has been stable.  Disc findings include increased cup to disc ratio, pallor. Macula : geographic atrophy.  ? ?  Notes ?Old CRV O OS, ischemic, stable over time, clear media good PRP 360 ? ?OD with collaterals on the optic nerve, no signs of complication no signs of recurrence of CRV O OD ? ?  ? ? ?  ?  ? ?  ?ASSESSMENT/PLAN: ? ?Stable central retinal vein occlusion of right eye ?No ongoing therapy, patient has a compensated condition, no findings that are acute or changing ? ?Nuclear sclerotic cataract of both eyes ?Mild to moderate OU ? ?Central retinal vein occlusion of left eye ?Compensated OS previous ischemic disease, loss of optic nerve perfusion, optic atrophy and previous neovascular disease treated with PRP elsewhere, prior to 30 years ago ? ?Advanced nonexudative age-related macular degeneration of left eye with subfoveal involvement ?Geographic atrophy centrally as a consequence of prior CME OS  ? ?  ICD-10-CM   ?1. Stable central retinal vein occlusion of left eye  H34.8122 OCT, Retina - OU - Both Eyes  ?  Color Fundus Photography Optos - OU - Both Eyes  ?  ?2. Stable central retinal vein occlusion of right eye  H34.8112   ?  ?3. Nuclear sclerotic cataract of both eyes  H25.13   ?  ?4. Advanced nonexudative age-related macular degeneration of left eye with subfoveal involvement  H35.3124   ?  ? ? ?1.  Patient continues to use safety glasses upon his bike rides and other activities ? ?2.  Only mild cataract present in either eye ? ?3.  No complications of neovascular disease prior left eye, and no recurrence of early CRVO OD over the last 30 years ? ?Ophthalmic Meds Ordered this visit:  ?No orders of the defined types were placed in this encounter. ? ? ?  ? ?Return in about 1 year (around 02/14/2023) for DILATE OU, COLOR FP, OCT. ? ?There are no Patient Instructions on file for this visit. ? ? ?Explained the diagnoses, plan, and follow up with the patient and they expressed understanding.  Patient expressed understanding of  the importance of proper follow up care.  ? ?Clent Demark. Jennyfer Nickolson M.D. ?Diseases & Surgery of the Retina and Vitreous ?[place] ?02/13/22 ? ? ? ? ?Abbreviations: ?M myopia (nearsighted); A astigma

## 2022-02-13 NOTE — Assessment & Plan Note (Signed)
No ongoing therapy, patient has a compensated condition, no findings that are acute or changing ?

## 2022-02-13 NOTE — Assessment & Plan Note (Signed)
Mild to moderate OU ?

## 2022-02-13 NOTE — Assessment & Plan Note (Signed)
Geographic atrophy centrally as a consequence of prior CME OS ?

## 2022-05-07 DIAGNOSIS — R7989 Other specified abnormal findings of blood chemistry: Secondary | ICD-10-CM | POA: Diagnosis not present

## 2022-05-07 DIAGNOSIS — I10 Essential (primary) hypertension: Secondary | ICD-10-CM | POA: Diagnosis not present

## 2022-05-07 DIAGNOSIS — Z125 Encounter for screening for malignant neoplasm of prostate: Secondary | ICD-10-CM | POA: Diagnosis not present

## 2022-05-07 DIAGNOSIS — E039 Hypothyroidism, unspecified: Secondary | ICD-10-CM | POA: Diagnosis not present

## 2022-05-14 DIAGNOSIS — E039 Hypothyroidism, unspecified: Secondary | ICD-10-CM | POA: Diagnosis not present

## 2022-05-14 DIAGNOSIS — Z0000 Encounter for general adult medical examination without abnormal findings: Secondary | ICD-10-CM | POA: Diagnosis not present

## 2022-05-14 DIAGNOSIS — I10 Essential (primary) hypertension: Secondary | ICD-10-CM | POA: Diagnosis not present

## 2022-05-14 DIAGNOSIS — N401 Enlarged prostate with lower urinary tract symptoms: Secondary | ICD-10-CM | POA: Diagnosis not present

## 2022-05-15 DIAGNOSIS — R82998 Other abnormal findings in urine: Secondary | ICD-10-CM | POA: Diagnosis not present

## 2022-06-19 DIAGNOSIS — Z85828 Personal history of other malignant neoplasm of skin: Secondary | ICD-10-CM | POA: Diagnosis not present

## 2022-06-19 DIAGNOSIS — L578 Other skin changes due to chronic exposure to nonionizing radiation: Secondary | ICD-10-CM | POA: Diagnosis not present

## 2022-06-19 DIAGNOSIS — D225 Melanocytic nevi of trunk: Secondary | ICD-10-CM | POA: Diagnosis not present

## 2022-06-19 DIAGNOSIS — D2239 Melanocytic nevi of other parts of face: Secondary | ICD-10-CM | POA: Diagnosis not present

## 2022-08-08 DIAGNOSIS — M25512 Pain in left shoulder: Secondary | ICD-10-CM | POA: Diagnosis not present

## 2022-08-08 DIAGNOSIS — M25519 Pain in unspecified shoulder: Secondary | ICD-10-CM | POA: Diagnosis not present

## 2022-08-14 DIAGNOSIS — M25512 Pain in left shoulder: Secondary | ICD-10-CM | POA: Diagnosis not present

## 2022-08-29 DIAGNOSIS — S42142D Displaced fracture of glenoid cavity of scapula, left shoulder, subsequent encounter for fracture with routine healing: Secondary | ICD-10-CM | POA: Diagnosis not present

## 2022-09-26 DIAGNOSIS — S42142D Displaced fracture of glenoid cavity of scapula, left shoulder, subsequent encounter for fracture with routine healing: Secondary | ICD-10-CM | POA: Diagnosis not present

## 2022-11-07 DIAGNOSIS — H524 Presbyopia: Secondary | ICD-10-CM | POA: Diagnosis not present

## 2022-11-07 DIAGNOSIS — S42142D Displaced fracture of glenoid cavity of scapula, left shoulder, subsequent encounter for fracture with routine healing: Secondary | ICD-10-CM | POA: Diagnosis not present

## 2023-01-14 DIAGNOSIS — K080 Exfoliation of teeth due to systemic causes: Secondary | ICD-10-CM | POA: Diagnosis not present

## 2023-01-16 DIAGNOSIS — Z1211 Encounter for screening for malignant neoplasm of colon: Secondary | ICD-10-CM | POA: Diagnosis not present

## 2023-01-16 DIAGNOSIS — R001 Bradycardia, unspecified: Secondary | ICD-10-CM | POA: Diagnosis not present

## 2023-01-22 DIAGNOSIS — K080 Exfoliation of teeth due to systemic causes: Secondary | ICD-10-CM | POA: Diagnosis not present

## 2023-01-31 DIAGNOSIS — K080 Exfoliation of teeth due to systemic causes: Secondary | ICD-10-CM | POA: Diagnosis not present

## 2023-02-19 ENCOUNTER — Encounter (INDEPENDENT_AMBULATORY_CARE_PROVIDER_SITE_OTHER): Payer: Medicare Other | Admitting: Ophthalmology

## 2023-02-21 DIAGNOSIS — K635 Polyp of colon: Secondary | ICD-10-CM | POA: Diagnosis not present

## 2023-02-21 DIAGNOSIS — K5730 Diverticulosis of large intestine without perforation or abscess without bleeding: Secondary | ICD-10-CM | POA: Diagnosis not present

## 2023-02-21 DIAGNOSIS — Z1211 Encounter for screening for malignant neoplasm of colon: Secondary | ICD-10-CM | POA: Diagnosis not present

## 2023-02-21 DIAGNOSIS — D122 Benign neoplasm of ascending colon: Secondary | ICD-10-CM | POA: Diagnosis not present

## 2023-03-07 ENCOUNTER — Other Ambulatory Visit: Payer: Self-pay

## 2023-03-07 ENCOUNTER — Emergency Department (HOSPITAL_BASED_OUTPATIENT_CLINIC_OR_DEPARTMENT_OTHER)
Admission: EM | Admit: 2023-03-07 | Discharge: 2023-03-07 | Disposition: A | Discharge status: Home / Self Care | Payer: Medicare Other | Attending: Emergency Medicine | Admitting: Emergency Medicine

## 2023-03-07 ENCOUNTER — Encounter (HOSPITAL_BASED_OUTPATIENT_CLINIC_OR_DEPARTMENT_OTHER): Payer: Self-pay

## 2023-03-07 ENCOUNTER — Emergency Department (HOSPITAL_BASED_OUTPATIENT_CLINIC_OR_DEPARTMENT_OTHER): Payer: Medicare Other

## 2023-03-07 ENCOUNTER — Emergency Department (HOSPITAL_BASED_OUTPATIENT_CLINIC_OR_DEPARTMENT_OTHER): Payer: Medicare Other | Admitting: Radiology

## 2023-03-07 DIAGNOSIS — S2241XA Multiple fractures of ribs, right side, initial encounter for closed fracture: Secondary | ICD-10-CM | POA: Diagnosis not present

## 2023-03-07 DIAGNOSIS — Y9355 Activity, bike riding: Secondary | ICD-10-CM | POA: Insufficient documentation

## 2023-03-07 DIAGNOSIS — M25511 Pain in right shoulder: Secondary | ICD-10-CM | POA: Diagnosis not present

## 2023-03-07 DIAGNOSIS — Z7982 Long term (current) use of aspirin: Secondary | ICD-10-CM | POA: Insufficient documentation

## 2023-03-07 DIAGNOSIS — Z79890 Hormone replacement therapy (postmenopausal): Secondary | ICD-10-CM | POA: Diagnosis not present

## 2023-03-07 DIAGNOSIS — Z85828 Personal history of other malignant neoplasm of skin: Secondary | ICD-10-CM | POA: Diagnosis not present

## 2023-03-07 DIAGNOSIS — S40011A Contusion of right shoulder, initial encounter: Secondary | ICD-10-CM

## 2023-03-07 DIAGNOSIS — S20211A Contusion of right front wall of thorax, initial encounter: Secondary | ICD-10-CM | POA: Diagnosis not present

## 2023-03-07 DIAGNOSIS — E039 Hypothyroidism, unspecified: Secondary | ICD-10-CM | POA: Insufficient documentation

## 2023-03-07 DIAGNOSIS — R0781 Pleurodynia: Secondary | ICD-10-CM | POA: Diagnosis not present

## 2023-03-07 MED ORDER — IBUPROFEN 600 MG PO TABS: 600.0000 mg | ORAL_TABLET | Freq: Four times a day (QID) | ORAL | 0 refills | Status: AC | PRN

## 2023-03-07 MED ORDER — MORPHINE SULFATE (PF) 4 MG/ML IV SOLN
4.0000 mg | Freq: Once | INTRAVENOUS | Status: AC
  Administered 2023-03-07: 4 mg via INTRAMUSCULAR
  Filled 2023-03-07: qty 1

## 2023-03-07 MED ORDER — OXYCODONE-ACETAMINOPHEN 5-325 MG PO TABS: 1.0000 | ORAL_TABLET | Freq: Four times a day (QID) | ORAL | 0 refills | Status: AC | PRN

## 2023-03-07 NOTE — ED Notes (Signed)
Reviewed AVS/discharge instruction with patient. Time allotted for and all questions answered. Patient is agreeable for d/c and escorted to ed exit by staff.  
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
## 2023-03-07 NOTE — ED Notes (Signed)
Port X-ray at bedside.  

## 2023-03-12 ENCOUNTER — Telehealth: Payer: Self-pay

## 2023-03-12 NOTE — Telephone Encounter (Signed)
     Patient  visit on 4/18  at [facility]  Have you been able to follow up with your primary care physician? Yes   The patient was or was not able to obtain any needed medicine or equipment. Yes   Are there diet recommendations that you are having difficulty following? Na   Patient expresses understanding of discharge instructions and education provided has no other needs at this time.  Yes      [facility] Guide, [facility] [phone] [address] [place], [place], [place] [address] Phone: [phone] Email: [email or web address]

## 2023-05-08 DIAGNOSIS — K080 Exfoliation of teeth due to systemic causes: Secondary | ICD-10-CM | POA: Diagnosis not present

## 2023-05-15 DIAGNOSIS — H2513 Age-related nuclear cataract, bilateral: Secondary | ICD-10-CM | POA: Diagnosis not present

## 2023-05-15 DIAGNOSIS — H348132 Central retinal vein occlusion, bilateral, stable: Secondary | ICD-10-CM | POA: Diagnosis not present

## 2023-05-15 DIAGNOSIS — H353124 Nonexudative age-related macular degeneration, left eye, advanced atrophic with subfoveal involvement: Secondary | ICD-10-CM | POA: Diagnosis not present

## 2023-05-29 DIAGNOSIS — E039 Hypothyroidism, unspecified: Secondary | ICD-10-CM | POA: Diagnosis not present

## 2023-05-29 DIAGNOSIS — N401 Enlarged prostate with lower urinary tract symptoms: Secondary | ICD-10-CM | POA: Diagnosis not present

## 2023-06-10 DIAGNOSIS — I10 Essential (primary) hypertension: Secondary | ICD-10-CM | POA: Diagnosis not present

## 2023-06-10 DIAGNOSIS — Z1339 Encounter for screening examination for other mental health and behavioral disorders: Secondary | ICD-10-CM | POA: Diagnosis not present

## 2023-06-10 DIAGNOSIS — R82998 Other abnormal findings in urine: Secondary | ICD-10-CM | POA: Diagnosis not present

## 2023-06-10 DIAGNOSIS — Z0000 Encounter for general adult medical examination without abnormal findings: Secondary | ICD-10-CM | POA: Diagnosis not present

## 2023-06-10 DIAGNOSIS — Z1331 Encounter for screening for depression: Secondary | ICD-10-CM | POA: Diagnosis not present

## 2023-06-20 DIAGNOSIS — L570 Actinic keratosis: Secondary | ICD-10-CM | POA: Diagnosis not present

## 2023-06-20 DIAGNOSIS — Z85828 Personal history of other malignant neoplasm of skin: Secondary | ICD-10-CM | POA: Diagnosis not present

## 2023-06-20 DIAGNOSIS — D225 Melanocytic nevi of trunk: Secondary | ICD-10-CM | POA: Diagnosis not present

## 2023-06-20 DIAGNOSIS — L821 Other seborrheic keratosis: Secondary | ICD-10-CM | POA: Diagnosis not present

## 2024-01-20 ENCOUNTER — Ambulatory Visit: Payer: Medicare Other | Admitting: Physician Assistant

## 2024-01-20 ENCOUNTER — Other Ambulatory Visit (INDEPENDENT_AMBULATORY_CARE_PROVIDER_SITE_OTHER): Payer: Self-pay

## 2024-01-20 ENCOUNTER — Ambulatory Visit (INDEPENDENT_AMBULATORY_CARE_PROVIDER_SITE_OTHER): Admitting: Physician Assistant

## 2024-01-20 DIAGNOSIS — Z96641 Presence of right artificial hip joint: Secondary | ICD-10-CM | POA: Insufficient documentation

## 2024-01-20 DIAGNOSIS — M25551 Pain in right hip: Secondary | ICD-10-CM

## 2024-01-20 MED ORDER — MELOXICAM 7.5 MG PO TABS: 7.5000 mg | ORAL_TABLET | Freq: Every day | ORAL | 0 refills | Status: AC

## 2024-01-20 NOTE — Progress Notes (Signed)
 Office Visit Note   Patient: Kyle Glass           Date of Birth: 10/30/1956           MRN: [id number] Visit Date: 01/20/2024              Requested by: Geoffry Paradise, MD [address] [place],  [place] [address] PCP: Geoffry Paradise, MD   Assessment & Plan: Visit Diagnoses:  1. Pain in right hip   2. History of right hip replacement     Plan: Patient is a pleasant very active 68 year old gentleman who is 13 years status post right total hip arthroplasty with Dr. Magnus Ivan.  He gets occasional short soreness.  He was doing an awkward plank several weeks ago and had the onset of posterior low back pain.  Denies any pain in his groin.  X-rays look great.  Findings most consistent with a low back strain.  He is getting slowly better would like for him to go on meloxicam for couple weeks if I no radicular findings may follow-up as needed  Follow-Up Instructions: No follow-ups on file.   Orders:  Orders Placed This Encounter  Procedures   XR HIP UNILAT W OR W/O PELVIS 2-3 VIEWS RIGHT   No orders of the defined types were placed in this encounter.     Procedures: No procedures performed   Clinical Data: No additional findings.   Subjective: Chief Complaint  Patient presents with   Right Hip - Pain    HPI Patient is a pleasant 68 year old gentleman who is 13 years status post right hip replacement with Dr. Magnus Ivan.  He is very active enjoys biking and strengthening he says he has done great since the replacement except in the last month or so he had pain after he jumped down to the floor to do a plank he got up and had pain.  He was able to ride a bike afterwards.  He has had some soreness come and go for a few years now back of hip does wrap around a bit he uses a ball roller on glutes which does help the soreness also helped with over-the-counter meds and heat Review of Systems  All other systems reviewed and are negative.    Objective: Vital Signs: There  were no vitals taken for this visit.  Physical Exam Constitutional:      Appearance: Normal appearance.  Pulmonary:     Effort: Pulmonary effort is normal.  Skin:    General: Skin is warm and dry.  Neurological:     General: No focal deficit present.     Mental Status: He is alert and oriented to person, place, and time.  Psychiatric:        Mood and Affect: Mood normal.        Behavior: Behavior normal.     Ortho Exam Examination of his hip he has excellent motion no pain in his groin he has good strength.  Does have some pain in the posterior right buttock.  Sensation is intact strength is intact he has good flexion and extension of his leg.  Has a mildly positive straight leg raise. Specialty Comments:  No specialty comments available.  Imaging: No results found.   PMFS History: Patient Active Problem List   Diagnosis Date Noted   History of right hip replacement 01/20/2024   Central retinal vein occlusion of left eye 02/13/2021   Advanced nonexudative age-related macular degeneration of left eye with subfoveal involvement  02/13/2021   OA (optic atrophy) 02/13/2021   Nuclear sclerotic cataract of both eyes 02/13/2021   Stable central retinal vein occlusion of right eye 02/13/2021   Past Medical History:  Diagnosis Date   Broken ankle    Cancer (HCC)    basel  celll  off nose   Chronic kidney disease    stones, slow stream   Hypothyroidism     No family history on file.  Past Surgical History:  Procedure Laterality Date   ACROMIO-CLAVICULAR JOINT REPAIR Left 04/30/2014   Procedure: ACROMIO-CLAVICULAR JOINT REPAIR;  Surgeon: Nadara Mustard, MD;  Location: [facility] OR;  Service: Orthopedics;  Laterality: Left;  Left Shoulder Acromioclavicular Joint Repair   CHOLECYSTECTOMY     HERNIA REPAIR     x2   JOINT REPLACEMENT     hip   testicular torsion     Social History   Occupational History   Not on file  Tobacco Use   Smoking status: Former   Smokeless tobacco:  Never  Substance and Sexual Activity   Alcohol use: Yes    Comment: occ   Drug use: No   Sexual activity: Not on file

## 2024-01-23 DIAGNOSIS — K080 Exfoliation of teeth due to systemic causes: Secondary | ICD-10-CM | POA: Diagnosis not present

## 2024-02-12 DIAGNOSIS — K080 Exfoliation of teeth due to systemic causes: Secondary | ICD-10-CM | POA: Diagnosis not present

## 2024-05-13 DIAGNOSIS — H2513 Age-related nuclear cataract, bilateral: Secondary | ICD-10-CM | POA: Diagnosis not present

## 2024-05-13 DIAGNOSIS — H348122 Central retinal vein occlusion, left eye, stable: Secondary | ICD-10-CM | POA: Diagnosis not present

## 2024-05-13 DIAGNOSIS — H353124 Nonexudative age-related macular degeneration, left eye, advanced atrophic with subfoveal involvement: Secondary | ICD-10-CM | POA: Diagnosis not present

## 2024-05-13 DIAGNOSIS — H348112 Central retinal vein occlusion, right eye, stable: Secondary | ICD-10-CM | POA: Diagnosis not present

## 2024-06-08 DIAGNOSIS — E039 Hypothyroidism, unspecified: Secondary | ICD-10-CM | POA: Diagnosis not present

## 2024-06-22 DIAGNOSIS — Z85828 Personal history of other malignant neoplasm of skin: Secondary | ICD-10-CM | POA: Diagnosis not present

## 2024-06-22 DIAGNOSIS — L820 Inflamed seborrheic keratosis: Secondary | ICD-10-CM | POA: Diagnosis not present

## 2024-06-22 DIAGNOSIS — D225 Melanocytic nevi of trunk: Secondary | ICD-10-CM | POA: Diagnosis not present

## 2024-06-22 DIAGNOSIS — D485 Neoplasm of uncertain behavior of skin: Secondary | ICD-10-CM | POA: Diagnosis not present

## 2024-06-22 DIAGNOSIS — D692 Other nonthrombocytopenic purpura: Secondary | ICD-10-CM | POA: Diagnosis not present

## 2024-06-22 DIAGNOSIS — I10 Essential (primary) hypertension: Secondary | ICD-10-CM | POA: Diagnosis not present

## 2024-06-22 DIAGNOSIS — L570 Actinic keratosis: Secondary | ICD-10-CM | POA: Diagnosis not present

## 2024-06-22 DIAGNOSIS — L821 Other seborrheic keratosis: Secondary | ICD-10-CM | POA: Diagnosis not present

## 2024-06-22 DIAGNOSIS — N401 Enlarged prostate with lower urinary tract symptoms: Secondary | ICD-10-CM | POA: Diagnosis not present

## 2024-06-24 DIAGNOSIS — Z0000 Encounter for general adult medical examination without abnormal findings: Secondary | ICD-10-CM | POA: Diagnosis not present

## 2024-06-24 DIAGNOSIS — Z1339 Encounter for screening examination for other mental health and behavioral disorders: Secondary | ICD-10-CM | POA: Diagnosis not present

## 2024-06-24 DIAGNOSIS — R82998 Other abnormal findings in urine: Secondary | ICD-10-CM | POA: Diagnosis not present

## 2024-06-24 DIAGNOSIS — Z1331 Encounter for screening for depression: Secondary | ICD-10-CM | POA: Diagnosis not present

## 2024-06-24 DIAGNOSIS — I10 Essential (primary) hypertension: Secondary | ICD-10-CM | POA: Diagnosis not present

## 2024-07-17 DIAGNOSIS — R1909 Other intra-abdominal and pelvic swelling, mass and lump: Secondary | ICD-10-CM | POA: Diagnosis not present

## 2024-07-17 DIAGNOSIS — Z23 Encounter for immunization: Secondary | ICD-10-CM | POA: Diagnosis not present

## 2024-07-23 ENCOUNTER — Encounter: Payer: Self-pay | Admitting: Family Medicine

## 2024-07-23 DIAGNOSIS — R1909 Other intra-abdominal and pelvic swelling, mass and lump: Secondary | ICD-10-CM

## 2024-07-27 ENCOUNTER — Other Ambulatory Visit: Payer: Self-pay | Admitting: Internal Medicine

## 2024-07-27 DIAGNOSIS — R1909 Other intra-abdominal and pelvic swelling, mass and lump: Secondary | ICD-10-CM

## 2024-07-28 ENCOUNTER — Ambulatory Visit
Admission: RE | Admit: 2024-07-28 | Discharge: 2024-07-28 | Disposition: A | Discharge status: Home / Self Care | Source: Ambulatory Visit | Attending: Internal Medicine | Admitting: Internal Medicine

## 2024-07-28 DIAGNOSIS — R1909 Other intra-abdominal and pelvic swelling, mass and lump: Secondary | ICD-10-CM

## 2024-07-30 DIAGNOSIS — K080 Exfoliation of teeth due to systemic causes: Secondary | ICD-10-CM | POA: Diagnosis not present

## 2024-08-11 ENCOUNTER — Ambulatory Visit: Payer: Self-pay | Admitting: General Surgery

## 2024-08-11 DIAGNOSIS — K4091 Unilateral inguinal hernia, without obstruction or gangrene, recurrent: Secondary | ICD-10-CM | POA: Diagnosis not present

## 2024-09-11 ENCOUNTER — Encounter (HOSPITAL_BASED_OUTPATIENT_CLINIC_OR_DEPARTMENT_OTHER): Payer: Self-pay | Admitting: General Surgery

## 2024-09-11 ENCOUNTER — Other Ambulatory Visit: Payer: Self-pay

## 2024-09-14 MED ORDER — CHLORHEXIDINE GLUCONATE CLOTH 2 % EX PADS: 6.0000 | MEDICATED_PAD | Freq: Once | CUTANEOUS | Status: DC

## 2024-09-14 NOTE — Progress Notes (Signed)

## 2024-09-18 ENCOUNTER — Encounter (HOSPITAL_BASED_OUTPATIENT_CLINIC_OR_DEPARTMENT_OTHER)
Admission: RE | Disposition: A | Discharge status: Home / Self Care | Payer: Self-pay | Source: Home / Self Care | Attending: General Surgery

## 2024-09-18 ENCOUNTER — Ambulatory Visit (HOSPITAL_BASED_OUTPATIENT_CLINIC_OR_DEPARTMENT_OTHER): Admitting: Certified Registered"

## 2024-09-18 ENCOUNTER — Other Ambulatory Visit: Payer: Self-pay

## 2024-09-18 ENCOUNTER — Encounter (HOSPITAL_BASED_OUTPATIENT_CLINIC_OR_DEPARTMENT_OTHER): Payer: Self-pay | Admitting: General Surgery

## 2024-09-18 ENCOUNTER — Ambulatory Visit (HOSPITAL_BASED_OUTPATIENT_CLINIC_OR_DEPARTMENT_OTHER)
Admission: RE | Admit: 2024-09-18 | Discharge: 2024-09-18 | Disposition: A | Discharge status: Home / Self Care | Attending: General Surgery | Admitting: General Surgery

## 2024-09-18 DIAGNOSIS — E039 Hypothyroidism, unspecified: Secondary | ICD-10-CM | POA: Diagnosis not present

## 2024-09-18 DIAGNOSIS — G8918 Other acute postprocedural pain: Secondary | ICD-10-CM | POA: Diagnosis not present

## 2024-09-18 DIAGNOSIS — K4091 Unilateral inguinal hernia, without obstruction or gangrene, recurrent: Secondary | ICD-10-CM

## 2024-09-18 DIAGNOSIS — Z87891 Personal history of nicotine dependence: Secondary | ICD-10-CM | POA: Diagnosis not present

## 2024-09-18 HISTORY — PX: INGUINAL HERNIA REPAIR: SHX194

## 2024-09-18 SURGERY — REPAIR, HERNIA, INGUINAL, ADULT
Anesthesia: General | Laterality: Right

## 2024-09-18 MED ORDER — ONDANSETRON HCL 4 MG/2ML IJ SOLN
INTRAMUSCULAR | Status: DC | PRN
  Administered 2024-09-18: 4 mg via INTRAVENOUS

## 2024-09-18 MED ORDER — FENTANYL CITRATE (PF) 100 MCG/2ML IJ SOLN
INTRAMUSCULAR | Status: AC
  Filled 2024-09-18: qty 2

## 2024-09-18 MED ORDER — LIDOCAINE 2% (20 MG/ML) 5 ML SYRINGE
INTRAMUSCULAR | Status: DC | PRN
  Administered 2024-09-18: 60 mg via INTRAVENOUS

## 2024-09-18 MED ORDER — DEXAMETHASONE SOD PHOSPHATE PF 10 MG/ML IJ SOLN
INTRAMUSCULAR | Status: DC | PRN
  Administered 2024-09-18: 10 mg via INTRAVENOUS

## 2024-09-18 MED ORDER — ROCURONIUM BROMIDE 10 MG/ML (PF) SYRINGE
PREFILLED_SYRINGE | INTRAVENOUS | Status: AC
  Filled 2024-09-18: qty 10

## 2024-09-18 MED ORDER — 0.9 % SODIUM CHLORIDE (POUR BTL) OPTIME
TOPICAL | Status: DC | PRN
  Administered 2024-09-18: 1000 mL

## 2024-09-18 MED ORDER — LACTATED RINGERS IV SOLN: INTRAVENOUS | Status: DC

## 2024-09-18 MED ORDER — CLONIDINE HCL (ANALGESIA) 100 MCG/ML EP SOLN
EPIDURAL | Status: DC | PRN
  Administered 2024-09-18: 50 ug

## 2024-09-18 MED ORDER — FENTANYL CITRATE (PF) 100 MCG/2ML IJ SOLN
25.0000 ug | INTRAMUSCULAR | Status: DC | PRN
  Administered 2024-09-18: 50 ug via INTRAVENOUS

## 2024-09-18 MED ORDER — LIDOCAINE 2% (20 MG/ML) 5 ML SYRINGE
INTRAMUSCULAR | Status: AC
  Filled 2024-09-18: qty 5

## 2024-09-18 MED ORDER — ONDANSETRON HCL 4 MG/2ML IJ SOLN: 4.0000 mg | Freq: Once | INTRAMUSCULAR | Status: DC | PRN

## 2024-09-18 MED ORDER — GABAPENTIN 100 MG PO CAPS
100.0000 mg | ORAL_CAPSULE | ORAL | Status: AC
  Administered 2024-09-18: 100 mg via ORAL

## 2024-09-18 MED ORDER — CEFAZOLIN SODIUM-DEXTROSE 2-4 GM/100ML-% IV SOLN
2.0000 g | INTRAVENOUS | Status: AC
  Administered 2024-09-18: 2 g via INTRAVENOUS

## 2024-09-18 MED ORDER — PROPOFOL 10 MG/ML IV BOLUS
INTRAVENOUS | Status: DC | PRN
  Administered 2024-09-18: 140 mg via INTRAVENOUS

## 2024-09-18 MED ORDER — OXYCODONE HCL 5 MG PO TABS: 5.0000 mg | ORAL_TABLET | Freq: Three times a day (TID) | ORAL | 0 refills | Status: AC | PRN

## 2024-09-18 MED ORDER — FENTANYL CITRATE (PF) 100 MCG/2ML IJ SOLN
INTRAMUSCULAR | Status: DC | PRN
  Administered 2024-09-18: 50 ug via INTRAVENOUS

## 2024-09-18 MED ORDER — MIDAZOLAM HCL (PF) 2 MG/2ML IJ SOLN
2.0000 mg | Freq: Once | INTRAMUSCULAR | Status: AC
  Administered 2024-09-18: 2 mg via INTRAVENOUS

## 2024-09-18 MED ORDER — MIDAZOLAM HCL 2 MG/2ML IJ SOLN
INTRAMUSCULAR | Status: AC
  Filled 2024-09-18: qty 2

## 2024-09-18 MED ORDER — GABAPENTIN 100 MG PO CAPS
ORAL_CAPSULE | ORAL | Status: AC
  Filled 2024-09-18: qty 1

## 2024-09-18 MED ORDER — ROCURONIUM BROMIDE 100 MG/10ML IV SOLN
INTRAVENOUS | Status: DC | PRN
  Administered 2024-09-18: 50 mg via INTRAVENOUS

## 2024-09-18 MED ORDER — BUPIVACAINE-EPINEPHRINE 0.25% -1:200000 IJ SOLN
INTRAMUSCULAR | Status: DC | PRN
  Administered 2024-09-18: 20 mL

## 2024-09-18 MED ORDER — ACETAMINOPHEN 500 MG PO TABS
ORAL_TABLET | ORAL | Status: AC
  Filled 2024-09-18: qty 2

## 2024-09-18 MED ORDER — SUGAMMADEX SODIUM 200 MG/2ML IV SOLN
INTRAVENOUS | Status: DC | PRN
  Administered 2024-09-18: 200 mg via INTRAVENOUS

## 2024-09-18 MED ORDER — FENTANYL CITRATE (PF) 100 MCG/2ML IJ SOLN
50.0000 ug | Freq: Once | INTRAMUSCULAR | Status: AC
  Administered 2024-09-18: 50 ug via INTRAVENOUS

## 2024-09-18 MED ORDER — CEFAZOLIN SODIUM-DEXTROSE 2-4 GM/100ML-% IV SOLN
INTRAVENOUS | Status: AC
  Filled 2024-09-18: qty 100

## 2024-09-18 MED ORDER — EPHEDRINE SULFATE (PRESSORS) 25 MG/5ML IV SOSY
PREFILLED_SYRINGE | INTRAVENOUS | Status: DC | PRN
  Administered 2024-09-18: 10 mg via INTRAVENOUS
  Administered 2024-09-18: 5 mg via INTRAVENOUS
  Administered 2024-09-18 (×2): 10 mg via INTRAVENOUS
  Administered 2024-09-18 (×3): 5 mg via INTRAVENOUS

## 2024-09-18 MED ORDER — PROPOFOL 10 MG/ML IV BOLUS
INTRAVENOUS | Status: AC
  Filled 2024-09-18: qty 20

## 2024-09-18 MED ORDER — GLYCOPYRROLATE 0.2 MG/ML IJ SOLN
INTRAMUSCULAR | Status: DC | PRN
  Administered 2024-09-18 (×2): .1 mg via INTRAVENOUS

## 2024-09-18 MED ORDER — ONDANSETRON HCL 4 MG/2ML IJ SOLN
INTRAMUSCULAR | Status: AC
Start: 2024-09-18 — End: 2024-09-18
  Filled 2024-09-18: qty 2

## 2024-09-18 MED ORDER — BUPIVACAINE-EPINEPHRINE (PF) 0.5% -1:200000 IJ SOLN
INTRAMUSCULAR | Status: DC | PRN
  Administered 2024-09-18: 30 mL via PERINEURAL

## 2024-09-18 MED ORDER — ACETAMINOPHEN 500 MG PO TABS
1000.0000 mg | ORAL_TABLET | ORAL | Status: AC
  Administered 2024-09-18: 1000 mg via ORAL

## 2024-09-18 SURGICAL SUPPLY — 36 items
BENZOIN TINCTURE PRP APPL 2/3 (GAUZE/BANDAGES/DRESSINGS) IMPLANT
BLADE CLIPPER SURG (BLADE) IMPLANT
BLADE HEX COATED 2.75 (ELECTRODE) ×2 IMPLANT
BLADE SURG 15 STRL LF DISP TIS (BLADE) ×2 IMPLANT
CHLORAPREP W/TINT 26 (MISCELLANEOUS) ×2 IMPLANT
COVER BACK TABLE 60X90IN (DRAPES) ×2 IMPLANT
COVER MAYO STAND STRL (DRAPES) ×2 IMPLANT
DERMABOND ADVANCED .7 DNX12 (GAUZE/BANDAGES/DRESSINGS) ×2 IMPLANT
DRAIN PENROSE .5X12 LATEX STL (DRAIN) ×2 IMPLANT
DRAPE LAPAROTOMY TRNSV 102X78 (DRAPES) ×2 IMPLANT
DRAPE UTILITY XL STRL (DRAPES) ×2 IMPLANT
ELECTRODE REM PT RTRN 9FT ADLT (ELECTROSURGICAL) ×2 IMPLANT
GLOVE BIO SURGEON STRL SZ7.5 (GLOVE) ×2 IMPLANT
GOWN STRL REUS W/ TWL LRG LVL3 (GOWN DISPOSABLE) ×4 IMPLANT
MESH ULTRAPRO 3X6 7.6X15CM (Mesh General) IMPLANT
NDL HYPO 25X1 1.5 SAFETY (NEEDLE) ×2 IMPLANT
NEEDLE HYPO 25X1 1.5 SAFETY (NEEDLE) ×1 IMPLANT
PACK BASIN DAY SURGERY FS (CUSTOM PROCEDURE TRAY) ×2 IMPLANT
PENCIL SMOKE EVACUATOR (MISCELLANEOUS) ×2 IMPLANT
SLEEVE SCD COMPRESS KNEE MED (STOCKING) ×2 IMPLANT
SOLN 0.9% NACL POUR BTL 1000ML (IV SOLUTION) ×2 IMPLANT
SPIKE FLUID TRANSFER (MISCELLANEOUS) IMPLANT
SPONGE T-LAP 18X18 ~~LOC~~+RFID (SPONGE) ×2 IMPLANT
STRIP CLOSURE SKIN 1/2X4 (GAUZE/BANDAGES/DRESSINGS) IMPLANT
SUT MON AB 4-0 PC3 18 (SUTURE) ×2 IMPLANT
SUT PROLENE 2 0 SH DA (SUTURE) ×4 IMPLANT
SUT SILK 2 0 SH (SUTURE) IMPLANT
SUT SILK 3 0 SH 30 (SUTURE) IMPLANT
SUT SILK 3-0 18XBRD TIE BLK (SUTURE) ×2 IMPLANT
SUT VIC AB 0 CT1 27XBRD ANBCTR (SUTURE) IMPLANT
SUT VIC AB 2-0 SH 27XBRD (SUTURE) ×2 IMPLANT
SUT VIC AB 3-0 SH 27X BRD (SUTURE) ×2 IMPLANT
SUT VICRYL 0 SH 27 (SUTURE) IMPLANT
SYR BULB EAR ULCER 3OZ GRN STR (SYRINGE) ×2 IMPLANT
SYR CONTROL 10ML LL (SYRINGE) ×2 IMPLANT
TOWEL GREEN STERILE FF (TOWEL DISPOSABLE) ×4 IMPLANT

## 2024-09-18 NOTE — Transfer of Care (Signed)
 Immediate Anesthesia Transfer of Care Note  Patient: Kyle Glass  Procedure(s) Performed: REPAIR, HERNIA, INGUINAL, ADULT (Right)  Patient Location: PACU  Anesthesia Type:GA combined with regional for post-op pain  Level of Consciousness: sedated  Airway & Oxygen Therapy: Patient Spontanous Breathing and Patient connected to face mask oxygen  Post-op Assessment: Report given to RN and Post -op Vital signs reviewed and stable  Post vital signs: Reviewed and stable  Last Vitals:  Vitals Value Taken Time  BP    Temp    Pulse    Resp    SpO2      Last Pain:  Vitals:   09/18/24 1028  TempSrc: Temporal  PainSc: 0-No pain      Patients Stated Pain Goal: 3 (09/18/24 1028)  Complications: No notable events documented.

## 2024-09-18 NOTE — Anesthesia Preprocedure Evaluation (Signed)
 Anesthesia Evaluation  Patient identified by MRN, date of birth, ID band Patient awake    Reviewed: Allergy & Precautions, NPO status , Patient's Chart, lab work & pertinent test results  Airway Mallampati: II  TM Distance: >3 FB Neck ROM: Full    Dental  (+) Teeth Intact, Dental Advisory Given   Pulmonary former smoker   Pulmonary exam normal breath sounds clear to auscultation       Cardiovascular negative cardio ROS Normal cardiovascular exam Rhythm:Regular Rate:Normal     Neuro/Psych negative neurological ROS     GI/Hepatic negative GI ROS, Neg liver ROS,,,  Endo/Other  Hypothyroidism    Renal/GU Renal InsufficiencyRenal disease     Musculoskeletal  RECURRENT RIGHT INGUINAL HERNIA   Abdominal   Peds  Hematology negative hematology ROS (+)   Anesthesia Other Findings Day of surgery medications reviewed with the patient.  Reproductive/Obstetrics                              Anesthesia Physical Anesthesia Plan  ASA: 2  Anesthesia Plan: General   Post-op Pain Management: Regional block*, Tylenol  PO (pre-op)* and Gabapentin PO (pre-op)*   Induction: Intravenous  PONV Risk Score and Plan: 3 and Ondansetron , Dexamethasone and Midazolam   Airway Management Planned: Oral ETT  Additional Equipment:   Intra-op Plan:   Post-operative Plan: Extubation in OR  Informed Consent: I have reviewed the patients History and Physical, chart, labs and discussed the procedure including the risks, benefits and alternatives for the proposed anesthesia with the patient or authorized representative who has indicated his/her understanding and acceptance.     Dental advisory given  Plan Discussed with: CRNA  Anesthesia Plan Comments:          Anesthesia Quick Evaluation

## 2024-09-18 NOTE — Interval H&P Note (Signed)
 History and Physical Interval Note:  09/18/2024 11:12 AM  Kyle Glass  has presented today for surgery, with the diagnosis of RECURRENT RIGHT INGUINAL HERNIA.  The various methods of treatment have been discussed with the patient and family. After consideration of risks, benefits and other options for treatment, the patient has consented to  Procedure(s) with comments: REPAIR, HERNIA, INGUINAL, ADULT (Right) - RIGHT INGUINAL REPAIR, MESH as a surgical intervention.  The patient's history has been reviewed, patient examined, no change in status, stable for surgery.  I have reviewed the patient's chart and labs.  Questions were answered to the patient's satisfaction.     Deward Null III

## 2024-09-18 NOTE — Progress Notes (Signed)
Assisted Dr. Desmond Lope with right, transabdominal plane, ultrasound guided block. Side rails up, monitors on throughout procedure. See vital signs in flow sheet. Tolerated Procedure well.

## 2024-09-18 NOTE — Discharge Instructions (Addendum)
 No tylenol  before 4:30 pm.   Post Anesthesia Home Care Instructions  Activity: Get plenty of rest for the remainder of the day. A responsible individual must stay with you for 24 hours following the procedure.  For the next 24 hours, DO NOT: -Drive a car -Advertising copywriter -Drink alcoholic beverages -Take any medication unless instructed by your physician -Make any legal decisions or sign important papers.  Meals: Start with liquid foods such as gelatin or soup. Progress to regular foods as tolerated. Avoid greasy, spicy, heavy foods. If nausea and/or vomiting occur, drink only clear liquids until the nausea and/or vomiting subsides. Call your physician if vomiting continues.  Special Instructions/Symptoms: Your throat may feel dry or sore from the anesthesia or the breathing tube placed in your throat during surgery. If this causes discomfort, gargle with warm salt water. The discomfort should disappear within 24 hours.  If you had a scopolamine patch placed behind your ear for the management of post- operative nausea and/or vomiting:  1. The medication in the patch is effective for 72 hours, after which it should be removed.  Wrap patch in a tissue and discard in the trash. Wash hands thoroughly with soap and water. 2. You may remove the patch earlier than 72 hours if you experience unpleasant side effects which may include dry mouth, dizziness or visual disturbances. 3. Avoid touching the patch. Wash your hands with soap and water after contact with the patch.

## 2024-09-18 NOTE — H&P (Signed)
 REFERRING PHYSICIAN: Shepard Charlie LABOR, MD PROVIDER: DEWARD GARNETTE NULL, MD MRN: [id number] DOB: [birth year] Subjective   Chief Complaint: New Consultation  History of Present Illness: Kyle Glass is a 68 y.o. male who is seen today as an office consultation for evaluation of New Consultation  We are asked to see the patient in consultation by Dr. Charlie Khan to evaluate him for a right inguinal hernia. The patient is a 68 year old white male who was recently playing with his dog when he jumped and hit him in the right groin. Shortly thereafter he started noticing a bulge in the right groin. He does have some occasional discomfort associated with it. He denies any nausea or vomiting. He did have bilateral inguinal hernia repair repairs as a child. He is otherwise in great health and rides a bicycle routinely. He also reports having had a vasectomy in the past as well as a right orchiectomy for torsion  Review of Systems: A complete review of systems was obtained from the patient. I have reviewed this information and discussed as appropriate with the patient. See HPI as well for other ROS.  ROS   Medical History: History reviewed. No pertinent past medical history.  Patient Active Problem List  Diagnosis  Recurrent right inguinal hernia   Past Surgical History:  Procedure Laterality Date  CHOLECYSTECTOMY    No Known Allergies  Current Outpatient Medications on File Prior to Visit  Medication Sig Dispense Refill  aspirin 81 MG EC tablet Take 81 mg by mouth once daily  b complex vitamins tablet Take 1 tablet by mouth once daily  cyclobenzaprine (FLEXERIL) 10 MG tablet Take 10 mg by mouth 3 (three) times daily as needed for Muscle spasms  HYDROcodone-acetaminophen  (NORCO) 5-325 mg tablet Take 1 tablet by mouth every 4 (four) hours as needed  ibuprofen  (MOTRIN ) 600 MG tablet Take 600 mg by mouth every 6 (six) hours as needed  L. acidophilus/Bifid. animalis 32 billion cell Cap Take  1 capsule by mouth once daily  levothyroxine (SYNTHROID) 50 MCG tablet Take 50 mcg by mouth every morning before breakfast (0630)  meloxicam  (MOBIC ) 7.5 MG tablet Take 7.5 mg by mouth once daily  mupirocin (BACTROBAN) 2 % ointment Place into one nostril  tamsulosin (FLOMAX) 0.4 mg capsule Take 0.4 mg by mouth  timolol (TIMOPTIC-XE) 0.5 % ophthalmic gel-forming solution Apply 1 drop to eye  valACYclovir (VALTREX) 1000 MG tablet Take 2,000 mg by mouth   No current facility-administered medications on file prior to visit.   Family History  Problem Relation Age of Onset  Diabetes Mother  Colon cancer Mother    Social History   Tobacco Use  Smoking Status Never  Smokeless Tobacco Never    Social History   Socioeconomic History  Marital status: Married  Tobacco Use  Smoking status: Never  Smokeless tobacco: Never  Substance and Sexual Activity  Alcohol  use: Yes  Alcohol /week: 1.0 - 2.0 standard drink of alcohol   Types: 1 - 2 Standard drinks or equivalent per week  Drug use: Never   Social Drivers of Health   Housing Stability: Unknown  Housing Stability Vital Sign  Homeless in the Last Year: No   Objective:   Vitals:  BP: 129/84  Pulse: 88  Temp: 36.5 C (97.7 F)  SpO2: 97%  Weight: 72.8 kg (160 lb 6.4 oz)  Height: 175.3 cm (5' 9)  PainSc: 0-No pain   Body mass index is 23.69 kg/m.  Physical Exam Constitutional:  General: He is not in acute  distress. Appearance: Normal appearance.  HENT:  Head: Normocephalic and atraumatic.  Right Ear: External ear normal.  Left Ear: External ear normal.  Nose: Nose normal.  Mouth/Throat:  Mouth: Mucous membranes are moist.  Pharynx: Oropharynx is clear.  Eyes:  General: No scleral icterus. Extraocular Movements: Extraocular movements intact.  Conjunctiva/sclera: Conjunctivae normal.  Pupils: Pupils are equal, round, and reactive to light.  Cardiovascular:  Rate and Rhythm: Normal rate and regular rhythm.   Pulses: Normal pulses.  Heart sounds: Normal heart sounds.  Pulmonary:  Effort: Pulmonary effort is normal. No respiratory distress.  Breath sounds: Normal breath sounds.  Abdominal:  General: Abdomen is flat. Bowel sounds are normal. There is no distension.  Palpations: Abdomen is soft.  Tenderness: There is no abdominal tenderness.  Genitourinary: Comments: There are well-healed bilateral groin scars. There is a small reducible bulge on the right. There is no obvious bulge or impulse with straining on the left Musculoskeletal:  General: No swelling or deformity. Normal range of motion.  Cervical back: Normal range of motion and neck supple. No tenderness.  Skin: General: Skin is warm and dry.  Coloration: Skin is not jaundiced.  Neurological:  General: No focal deficit present.  Mental Status: He is alert and oriented to person, place, and time.  Psychiatric:  Mood and Affect: Mood normal.  Behavior: Behavior normal.     Labs, Imaging and Diagnostic Testing:  Assessment and Plan:   Diagnoses and all orders for this visit:  Recurrent right inguinal hernia - [facility] Case Posting Request; Future   The patient appears to have a small but symptomatic recurrent right inguinal hernia. Because of the risk of incarceration and strangulation I feel he would benefit from having this fixed. He would also like to have this done. I have discussed with him in detail the risks and benefits of the operation as well as some of the technical aspects including the use of mesh and the risk of chronic pain and he understands and wishes to proceed. We will move forward with surgical scheduling.

## 2024-09-18 NOTE — Op Note (Signed)
 09/18/2024  12:39 PM  PATIENT:  Kyle Glass  68 y.o. male  PRE-OPERATIVE DIAGNOSIS:  RECURRENT RIGHT INGUINAL HERNIA  POST-OPERATIVE DIAGNOSIS:  RECURRENT RIGHT INGUINAL HERNIA  PROCEDURE:  Procedure(s) with comments:  RIGHT INGUINAL HERNIA REPAIR WITH MESH  SURGEON:  Surgeons and Role:    * Curvin Deward MOULD, MD - Primary  PHYSICIAN ASSISTANT:   ASSISTANTS: none   ANESTHESIA:   local and general  EBL:  15 mL   BLOOD ADMINISTERED:none  DRAINS: none   LOCAL MEDICATIONS USED:  MARCAINE     SPECIMEN:  Source of Specimen:  hernia sac  DISPOSITION OF SPECIMEN:  PATHOLOGY  COUNTS:  YES  TOURNIQUET:  * No tourniquets in log *  DICTATION: .Dragon Dictation  After informed consent was obtained patient was brought to the waiting room and placed in the supine position on the operating table.  After adequate induction of general anesthesia the patient's abdomen and right groin were prepped with ChloraPrep, allowed to dry, and draped in usual sterile manner.  An appropriate timeout was performed.  The right groin was then infiltrated with quarter percent Marcaine.  A small incision was made from the edge of the pubic tubercle on the right towards the anterior superior iliac spine through his previous incision.  The incision was carried through the skin and subcutaneous tissue sharply with the electrocautery down to the fascia of the external oblique was encountered.  A small bridging vein was clamped with hemostats, divided, and ligated with 3-0 silk ties.  The external oblique fascia was then opened along its fibers towards the apex of the external ring with a 15 blade knife and Metzenbaum scissors.  A wheat Lander retractor was deployed.  Blunt dissection was carried out of the cord structures until I can be surrounded between 2 fingers.A 1/2 inch Penrose drain was placed around the cord structures for retraction purposes.  The cord was then gently skeletonized by blunt hemostat  dissection.  I was able to identify a small hernia sac associated with the cord.  It was carefully dissected free from the rest of the cord structures taking care to avoid any injury to the cord.  The sac was then ligated at its base with a 2-0 silk suture ligature and the distal sac was excised and sent to pathology for further evaluation.  The stump of the sac was allowed to retract back beneath the transversalis muscle.  Next a 3 x 6 piece of UltraPro mesh was chosen and cut to the appropriate size.  The mesh was sewed inferiorly to the shelving edge of the inguinal ligament with a running 2-0 Prolene stitch.  Tails were cut in the mesh laterally and the tails were wrapped around the cord structures.  Medially and superiorly the mesh was sewed to the aponeurotic strength layer of the transversalis with interrupted 2-0 Prolene vertical mattress stitches.  Lateral to the cord the tails of the mesh were anchored to the shelving edge of the inguinal ligament with interrupted 2-0 Prolene stitches.  Once this was accomplished the mesh appeared to be in good position and the hernia seem well repaired.  The wound was irrigated with copious amounts of saline.  The ilioinguinal nerve was looked for but never seen.  There was some scar tissue in the canal but it was easy to work with.  The external oblique fascia was then reapproximated with a running 2-0 Vicryl stitch.  The wound was infiltrated with more quarter percent Marcaine.  The  subcutaneous fascia was closed with a running 3-0 Vicryl stitch.  The skin was then closed with a running 4-0 Monocryl subcuticular stitch.  Dermabond dressings were applied.  The patient tolerated the procedure well.  At the end of the case all needle sponge and instrument counts were correct.  The patient was then awakened and taken recovery in stable condition.    PLAN OF CARE: Discharge to home after PACU  PATIENT DISPOSITION:  PACU - hemodynamically stable.   Delay start of  Pharmacological VTE agent (>24hrs) due to surgical blood loss or risk of bleeding: not applicable

## 2024-09-18 NOTE — Anesthesia Postprocedure Evaluation (Signed)
 Anesthesia Post Note  Patient: Kyle Glass  Procedure(s) Performed: REPAIR, HERNIA, INGUINAL, ADULT (Right)     Patient location during evaluation: PACU Anesthesia Type: General Level of consciousness: awake and alert Pain management: pain level controlled Vital Signs Assessment: post-procedure vital signs reviewed and stable Respiratory status: spontaneous breathing, nonlabored ventilation and respiratory function stable Cardiovascular status: blood pressure returned to baseline and stable Postop Assessment: no apparent nausea or vomiting Anesthetic complications: no   No notable events documented.  Last Vitals:  Vitals:   09/18/24 1322 09/18/24 1329  BP:  (!) 133/91  Pulse: (!) 51 (!) 53  Resp: (!) 8 16  Temp:  (!) 36.2 C  SpO2: 97% 98%    Last Pain:  Vitals:   09/18/24 1329  TempSrc:   PainSc: 3                  Garnette FORBES Skillern

## 2024-09-18 NOTE — Anesthesia Procedure Notes (Signed)
 Procedure Name: Intubation Date/Time: 09/18/2024 11:33 AM  Performed by: Debarah Chiquita LABOR, CRNAPre-anesthesia Checklist: Patient identified, Emergency Drugs available, Suction available and Patient being monitored Patient Re-evaluated:Patient Re-evaluated prior to induction Oxygen Delivery Method: Circle system utilized Preoxygenation: Pre-oxygenation with 100% oxygen Induction Type: IV induction Ventilation: Mask ventilation without difficulty Laryngoscope Size: Mac and 4 Grade View: Grade I Tube type: Oral Tube size: 7.5 mm Number of attempts: 1 Airway Equipment and Method: Stylet and Bite block Placement Confirmation: ETT inserted through vocal cords under direct vision, positive ETCO2 and breath sounds checked- equal and bilateral Secured at: 23 cm Tube secured with: Tape Dental Injury: Teeth and Oropharynx as per pre-operative assessment

## 2024-09-18 NOTE — Anesthesia Procedure Notes (Signed)
 Anesthesia Regional Block: TAP block   Pre-Anesthetic Checklist: , timeout performed,  Correct Patient, Correct Site, Correct Laterality,  Correct Procedure, Correct Position, site marked,  Risks and benefits discussed,  Surgical consent,  Pre-op evaluation,  At surgeon's request and post-op pain management  Laterality: Right  Prep: chloraprep       Needles:  Injection technique: Single-shot  Needle Type: Echogenic Stimulator Needle     Needle Length: 10cm  Needle Gauge: 21     Additional Needles:   Procedures:,,,, ultrasound used (permanent image in chart),,    Narrative:  Start time: 09/18/2024 10:50 AM End time: 09/18/2024 10:57 AM Injection made incrementally with aspirations every 5 mL.  Performed by: Personally   Additional Notes: No pain on injection. No increased resistance to injection. Injection made in 5cc increments.  Good needle visualization.  Patient tolerated procedure well.

## 2024-09-21 ENCOUNTER — Encounter (HOSPITAL_BASED_OUTPATIENT_CLINIC_OR_DEPARTMENT_OTHER): Payer: Self-pay | Admitting: General Surgery

## 2024-09-21 LAB — SURGICAL PATHOLOGY
